# Patient Record
Sex: Female | Born: 1980 | Race: White | Hispanic: No | Marital: Married | Smoking: Current every day smoker
Health system: Southern US, Community
[De-identification: ages and names within clinical notes are randomized; demographics above are authoritative.]

## PROBLEM LIST (undated history)

## (undated) DIAGNOSIS — L989 Disorder of the skin and subcutaneous tissue, unspecified: Secondary | ICD-10-CM

## (undated) DIAGNOSIS — L0390 Cellulitis, unspecified: Secondary | ICD-10-CM

## (undated) HISTORY — PX: INTRAUTERINE DEVICE (IUD) INSERTION: SHX5877

## (undated) HISTORY — DX: Disorder of the skin and subcutaneous tissue, unspecified: L98.9

## (undated) HISTORY — DX: Cellulitis, unspecified: L03.90

---

## 2002-11-05 ENCOUNTER — Other Ambulatory Visit: Admission: RE | Admit: 2002-11-05 | Discharge: 2002-11-05 | Payer: Self-pay | Admitting: Family Medicine

## 2002-11-05 ENCOUNTER — Other Ambulatory Visit: Admission: RE | Admit: 2002-11-05 | Discharge: 2002-11-05 | Payer: Self-pay | Admitting: Internal Medicine

## 2006-06-04 ENCOUNTER — Observation Stay: Payer: Self-pay | Admitting: Unknown Physician Specialty

## 2006-07-21 ENCOUNTER — Inpatient Hospital Stay: Payer: Self-pay | Admitting: Unknown Physician Specialty

## 2009-07-11 ENCOUNTER — Emergency Department: Payer: Self-pay | Admitting: Emergency Medicine

## 2012-12-10 ENCOUNTER — Emergency Department: Payer: Self-pay | Admitting: Emergency Medicine

## 2012-12-10 LAB — URINALYSIS, COMPLETE
Bilirubin,UR: NEGATIVE
Blood: NEGATIVE
Glucose,UR: NEGATIVE mg/dL (ref 0–75)
RBC,UR: 2 /HPF (ref 0–5)
Specific Gravity: 1.01 (ref 1.003–1.030)
WBC UR: 1 /HPF (ref 0–5)

## 2012-12-10 LAB — COMPREHENSIVE METABOLIC PANEL
Albumin: 3.7 g/dL (ref 3.4–5.0)
Alkaline Phosphatase: 80 U/L (ref 50–136)
Anion Gap: 3 — ABNORMAL LOW (ref 7–16)
Bilirubin,Total: 0.4 mg/dL (ref 0.2–1.0)
Calcium, Total: 8.7 mg/dL (ref 8.5–10.1)
Chloride: 106 mmol/L (ref 98–107)
Co2: 29 mmol/L (ref 21–32)
Creatinine: 0.89 mg/dL (ref 0.60–1.30)
EGFR (African American): >60
Glucose: 108 mg/dL — ABNORMAL HIGH (ref 65–99)
Osmolality: 275 (ref 275–301)
Potassium: 3.9 mmol/L (ref 3.5–5.1)
SGOT(AST): 36 U/L (ref 15–37)
SGPT (ALT): 54 U/L (ref 12–78)
Sodium: 138 mmol/L (ref 136–145)

## 2012-12-10 LAB — CBC
HGB: 12.7 g/dL (ref 12.0–16.0)
MCH: 30.3 pg (ref 26.0–34.0)
MCHC: 33.7 g/dL (ref 32.0–36.0)
MCV: 90 fL (ref 80–100)
RBC: 4.2 10*6/uL (ref 3.80–5.20)
RDW: 13.1 % (ref 11.5–14.5)
WBC: 12.8 10*3/uL — ABNORMAL HIGH (ref 3.6–11.0)

## 2012-12-15 ENCOUNTER — Ambulatory Visit: Payer: Self-pay | Admitting: Family Medicine

## 2013-06-29 ENCOUNTER — Ambulatory Visit: Payer: Self-pay | Admitting: Family Medicine

## 2014-01-15 IMAGING — US ABDOMEN ULTRASOUND
1 series · 13 of 25 positions shown · non-contrast
Comparison: none

REASON FOR EXAM: Epigastric pain
COMMENTS:

[Series 1: abdomen ultrasound · 0.31mm/px · 13 of 92 slices shown]
[im 1/92]
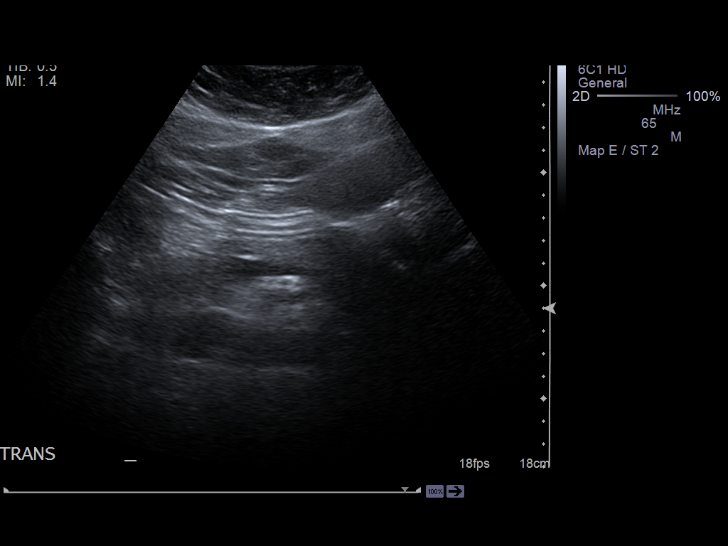
[im 8/92]
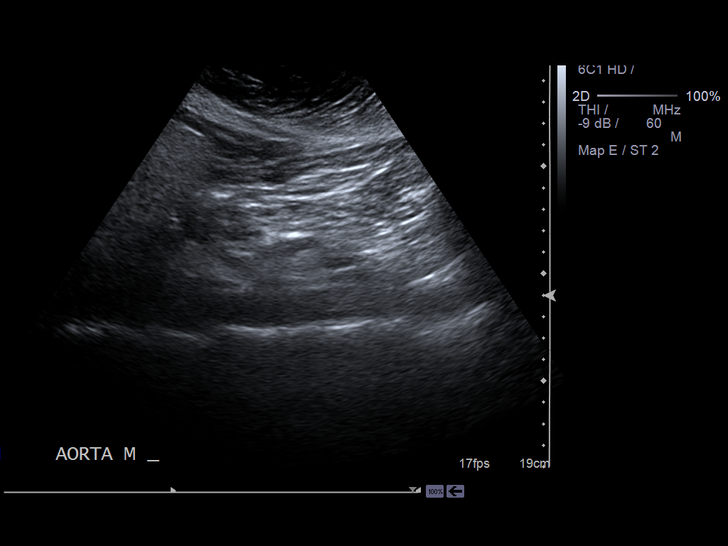
[im 16/92]
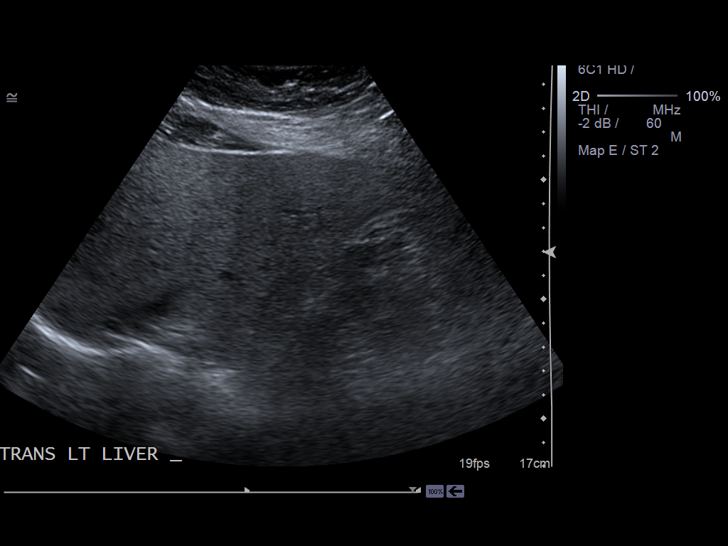
[im 23/92]
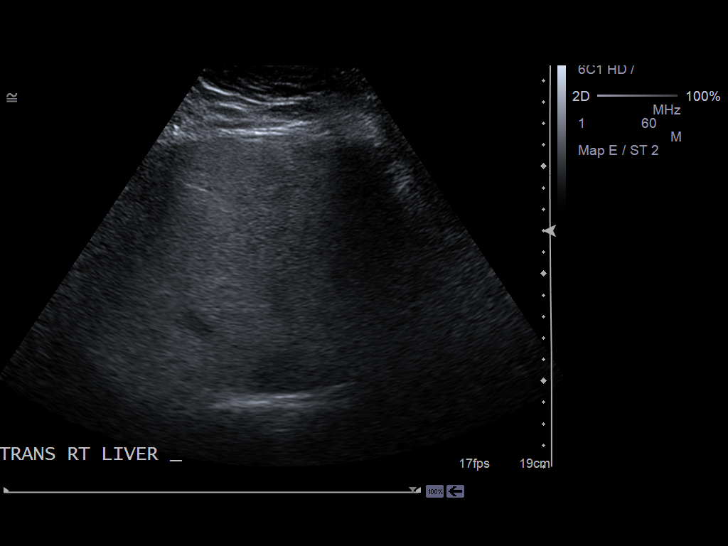
[im 31/92]
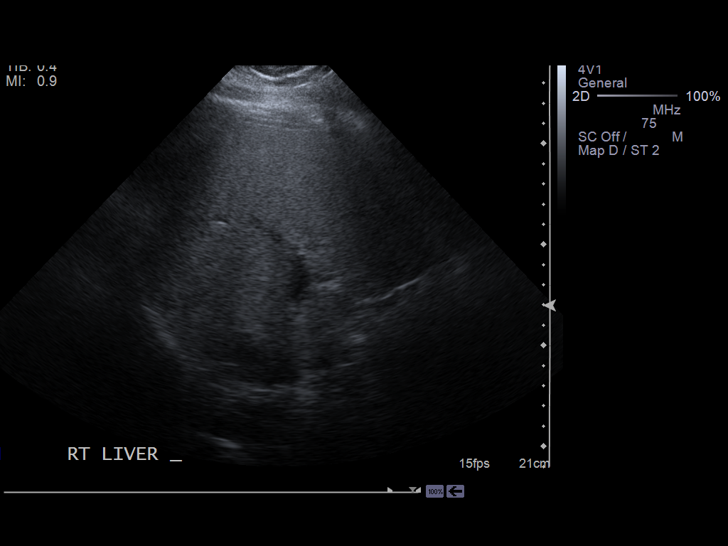
[im 38/92]
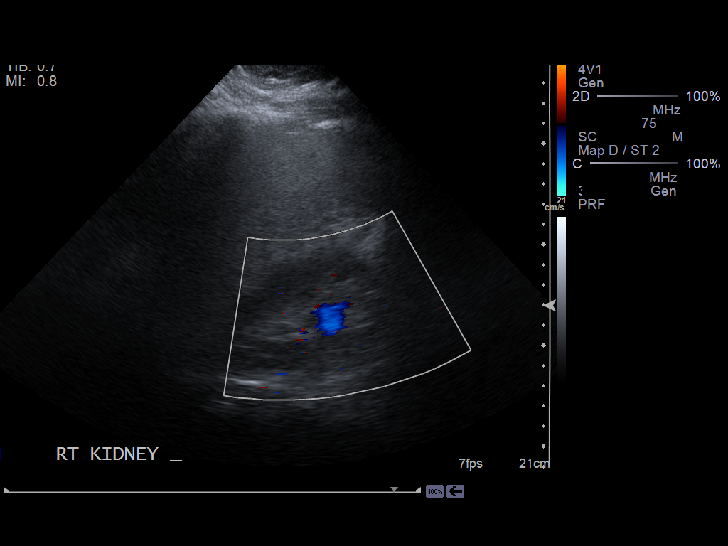
[im 46/92]
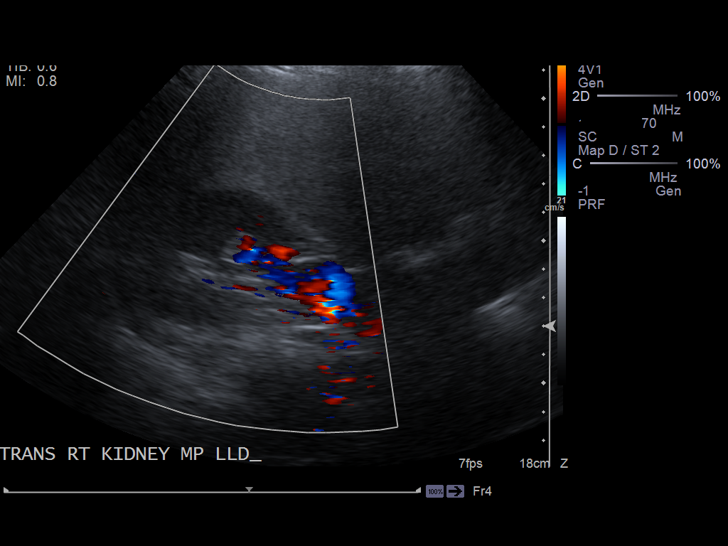
[im 54/92]
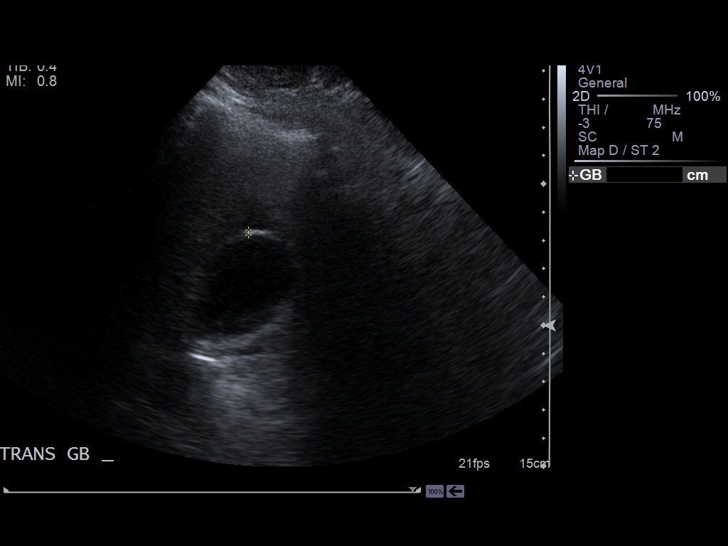
[im 61/92]
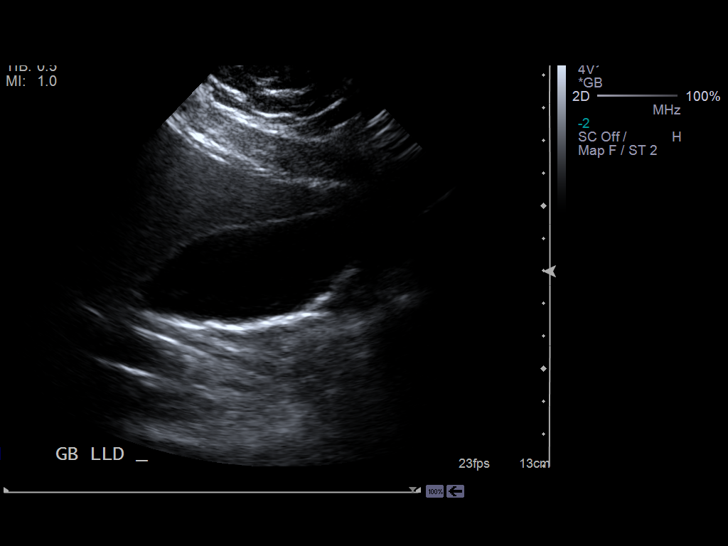
[im 69/92]
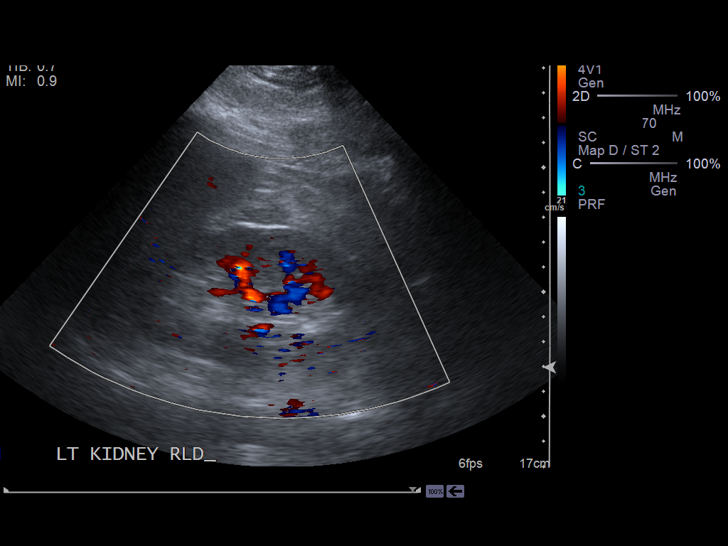
[im 76/92]
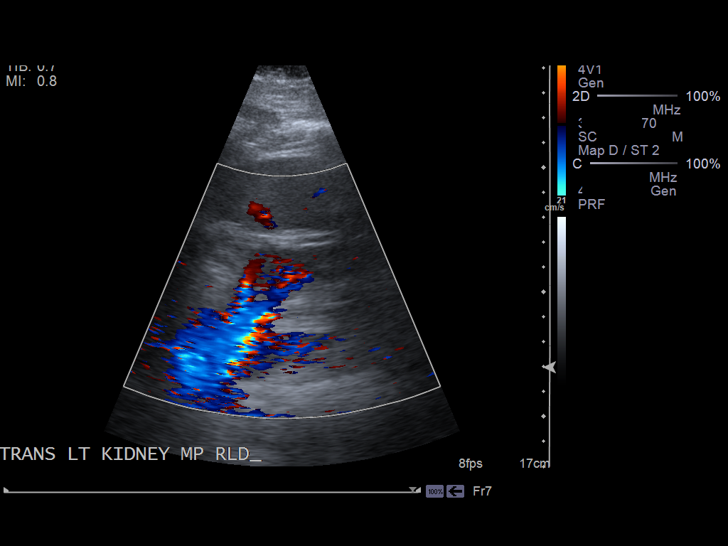
[im 84/92]
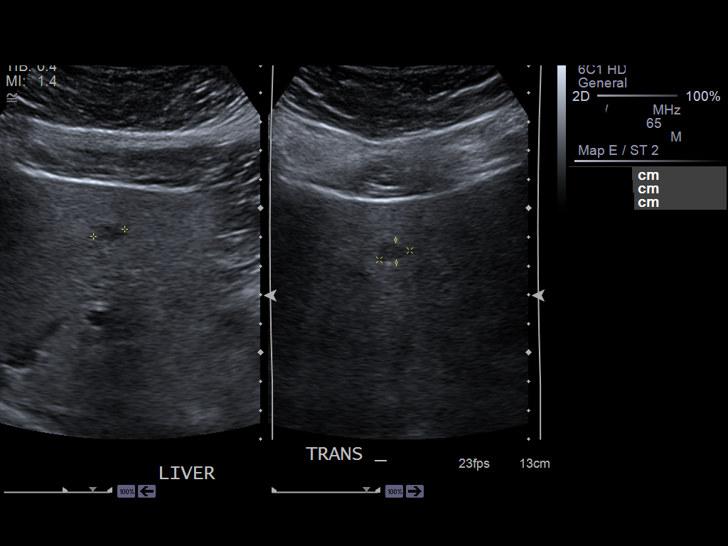
[im 92/92]
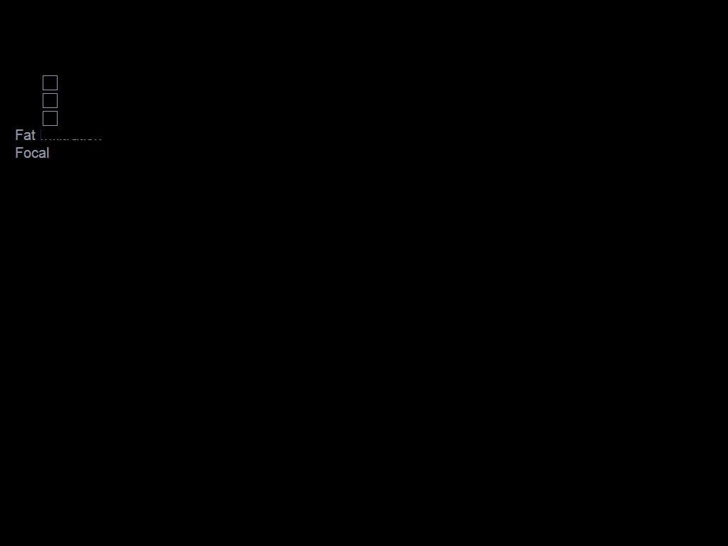

[13 of 25 positions shown; findings below may reference images not displayed]

PROCEDURE:     US  - US ABDOMEN GENERAL SURVEY  - December 15, 2012  [DATE]

RESULT:     The liver demonstrates mildly increased echotexture and is
borderline enlarged measuring 19.2 cm in greatest dimension. A subtle
hypoechoic focus in the left hepatic lobe measures 11 x 11 x 8 mm. This may
reflect a cyst but cannot be characterized further. There is no intrahepatic
ductal dilation. Portal venous flow is normal in direction toward the liver.

The gallbladder is adequately distended with no evidence of stones, wall
thickening, or pericholecystic fluid. There is no positive sonographic
Murphy's sign. The common bile duct is normal at 2.8 mm in diameter.

Bowel gas limits evaluation of the pancreatic head. The pancreatic body and
tail appear normal. The spleen, inferior vena cava, abdominal aorta, and
kidneys are normal in appearance.
IMPRESSION: 1. The liver is borderline enlarged and exhibits increased echogenicity
consistent with fatty infiltration. There is likely a cyst in the left
hepatic lobe but this cannot be precisely characterized.
2. The gallbladder is normal in appearance.
3. Bowel gas limited evaluation of the pancreatic head.
4. The kidneys are normal in appearance.

If the patient's symptoms persist and remain unexplained, followup CT
scanning of the abdomen and pelvis may be useful.

[REDACTED]

## 2014-06-30 ENCOUNTER — Emergency Department: Payer: Self-pay | Admitting: Student

## 2014-06-30 LAB — COMPREHENSIVE METABOLIC PANEL
ALBUMIN: 3.8 g/dL (ref 3.4–5.0)
ALT: 70 U/L — AB
ANION GAP: 4 — AB (ref 7–16)
AST: 36 U/L (ref 15–37)
Alkaline Phosphatase: 57 U/L
BILIRUBIN TOTAL: 0.7 mg/dL (ref 0.2–1.0)
BUN: 12 mg/dL (ref 7–18)
CALCIUM: 8.9 mg/dL (ref 8.5–10.1)
CO2: 30 mmol/L (ref 21–32)
CREATININE: 0.9 mg/dL (ref 0.60–1.30)
Chloride: 106 mmol/L (ref 98–107)
EGFR (African American): >60
EGFR (Non-African Amer.): >60
Glucose: 92 mg/dL (ref 65–99)
OSMOLALITY: 279 (ref 275–301)
Potassium: 4.3 mmol/L (ref 3.5–5.1)
Sodium: 140 mmol/L (ref 136–145)
TOTAL PROTEIN: 8.2 g/dL (ref 6.4–8.2)

## 2014-06-30 LAB — HCG, QUANTITATIVE, PREGNANCY

## 2014-06-30 LAB — CBC
HCT: 39.9 % (ref 35.0–47.0)
HGB: 13.3 g/dL (ref 12.0–16.0)
MCH: 30.7 pg (ref 26.0–34.0)
MCHC: 33.3 g/dL (ref 32.0–36.0)
MCV: 92 fL (ref 80–100)
Platelet: 272 10*3/uL (ref 150–440)
RBC: 4.32 10*6/uL (ref 3.80–5.20)
RDW: 13.7 % (ref 11.5–14.5)
WBC: 7 10*3/uL (ref 3.6–11.0)

## 2014-06-30 LAB — URINALYSIS, COMPLETE
Bilirubin,UR: NEGATIVE
Blood: NEGATIVE
Glucose,UR: NEGATIVE mg/dL (ref 0–75)
Leukocyte Esterase: NEGATIVE
NITRITE: NEGATIVE
PH: 5 (ref 4.5–8.0)
Protein: NEGATIVE
RBC,UR: 2 /HPF (ref 0–5)
Specific Gravity: 1.023 (ref 1.003–1.030)

## 2014-06-30 LAB — TROPONIN I: Troponin-I: <0.02 ng/mL

## 2014-06-30 LAB — LIPASE, BLOOD: Lipase: 157 U/L (ref 73–393)

## 2017-01-01 ENCOUNTER — Other Ambulatory Visit
Admission: RE | Admit: 2017-01-01 | Discharge: 2017-01-01 | Disposition: A | Discharge status: Home / Self Care | Payer: 59 | Source: Ambulatory Visit | Attending: Internal Medicine | Admitting: Internal Medicine

## 2017-01-01 DIAGNOSIS — J029 Acute pharyngitis, unspecified: Secondary | ICD-10-CM | POA: Insufficient documentation

## 2017-01-01 LAB — CBC WITH DIFFERENTIAL/PLATELET
Basophils Absolute: 0.1 10*3/uL (ref 0–0.1)
Basophils Relative: 1 %
Eosinophils Absolute: 0.4 10*3/uL (ref 0–0.7)
Eosinophils Relative: 3 %
HEMATOCRIT: 40.4 % (ref 35.0–47.0)
Hemoglobin: 13.5 g/dL (ref 12.0–16.0)
LYMPHS ABS: 2.6 10*3/uL (ref 1.0–3.6)
Lymphocytes Relative: 18 %
MCH: 30.3 pg (ref 26.0–34.0)
MCHC: 33.4 g/dL (ref 32.0–36.0)
MCV: 90.7 fL (ref 80.0–100.0)
MONO ABS: 1.1 10*3/uL — AB (ref 0.2–0.9)
Monocytes Relative: 8 %
Neutro Abs: 9.9 10*3/uL — ABNORMAL HIGH (ref 1.4–6.5)
Neutrophils Relative %: 70 %
Platelets: 361 10*3/uL (ref 150–440)
RBC: 4.45 MIL/uL (ref 3.80–5.20)
RDW: 13.5 % (ref 11.5–14.5)
WBC: 14 10*3/uL — ABNORMAL HIGH (ref 3.6–11.0)

## 2017-04-07 ENCOUNTER — Ambulatory Visit: Payer: Self-pay | Admitting: Obstetrics and Gynecology

## 2017-04-29 ENCOUNTER — Ambulatory Visit: Payer: Self-pay | Admitting: Obstetrics and Gynecology

## 2017-05-05 ENCOUNTER — Ambulatory Visit (INDEPENDENT_AMBULATORY_CARE_PROVIDER_SITE_OTHER): Payer: BLUE CROSS/BLUE SHIELD | Admitting: Obstetrics and Gynecology

## 2017-05-05 ENCOUNTER — Encounter: Payer: Self-pay | Admitting: Obstetrics and Gynecology

## 2017-05-05 VITALS — BP 128/78 | Ht 64.0 in | Wt 318.0 lb

## 2017-05-05 DIAGNOSIS — Z30432 Encounter for removal of intrauterine contraceptive device: Secondary | ICD-10-CM | POA: Diagnosis not present

## 2017-05-05 DIAGNOSIS — Z975 Presence of (intrauterine) contraceptive device: Secondary | ICD-10-CM | POA: Diagnosis not present

## 2017-05-05 DIAGNOSIS — Z538 Procedure and treatment not carried out for other reasons: Secondary | ICD-10-CM

## 2017-05-06 NOTE — Progress Notes (Signed)
   IUD Removal  Patient identified, informed consent performed, consent signed.  Patient was in the dorsal lithotomy position, normal external genitalia was noted.  A speculum was placed in the patient's vagina, normal discharge was noted, no lesions. The cervix was visualized, no lesions, no abnormal discharge.  The strings of the IUD were not visualized, so Kelly forceps were introduced into the endometrial cavity and the IUD could not be removed. An IUD "hook" was utilized and the strings could not be found.  Aborted procedure due to patient discomfort.   Will order pelvic ultrasound to assess location of IUD.  Thomasene MohairStephen Zakaria Sedor, MD 05/06/2017 1:25 PM

## 2017-05-15 ENCOUNTER — Ambulatory Visit (INDEPENDENT_AMBULATORY_CARE_PROVIDER_SITE_OTHER): Payer: BLUE CROSS/BLUE SHIELD

## 2017-05-15 ENCOUNTER — Ambulatory Visit (INDEPENDENT_AMBULATORY_CARE_PROVIDER_SITE_OTHER): Payer: BLUE CROSS/BLUE SHIELD | Admitting: Obstetrics and Gynecology

## 2017-05-15 ENCOUNTER — Other Ambulatory Visit: Payer: Self-pay | Admitting: Obstetrics and Gynecology

## 2017-05-15 DIAGNOSIS — Z975 Presence of (intrauterine) contraceptive device: Secondary | ICD-10-CM

## 2017-05-15 DIAGNOSIS — Z538 Procedure and treatment not carried out for other reasons: Secondary | ICD-10-CM | POA: Diagnosis not present

## 2017-05-15 DIAGNOSIS — T839XXS Unspecified complication of genitourinary prosthetic device, implant and graft, sequela: Secondary | ICD-10-CM

## 2017-05-16 DIAGNOSIS — T839XXS Unspecified complication of genitourinary prosthetic device, implant and graft, sequela: Secondary | ICD-10-CM | POA: Insufficient documentation

## 2017-05-16 NOTE — Progress Notes (Signed)
   Gynecology Ultrasound Follow Up   Chief Complaint  Patient presents with  . Follow-up  IUD location   History of Present Illness: Patient is a 36 y.o. female who presents today for ultrasound evaluation of location of IUD .  Ultrasound demonstrates the following findings Adnexa: no masses seen  Uterus: anteverted with endometrial stripe  8.4 mm Additional: IUD is in correct location, IUD Strings about 2.5 cm from external cervical os.  Past Medical History:  Diagnosis Date  . Cellulitis   . Skin disorder     Past Surgical History:  Procedure Laterality Date  . CESAREAN SECTION    . INTRAUTERINE DEVICE (IUD) INSERTION      Family History  Problem Relation Age of Onset  . Diabetes Mellitus II Maternal Grandmother   . Diabetes Mellitus II Maternal Grandfather     Social History   Social History  . Marital status: Married    Spouse name: N/A  . Number of children: N/A  . Years of education: N/A   Occupational History  . Not on file.   Social History Main Topics  . Smoking status: Current Every Day Smoker  . Smokeless tobacco: Never Used  . Alcohol use Yes  . Drug use: No  . Sexual activity: Yes    Birth control/ protection: IUD   Other Topics Concern  . Not on file   Social History Narrative  . No narrative on file    No Known Allergies  Prior to Admission medications   Medication Sig Start Date End Date Taking? Authorizing Provider  Ixekizumab (TALTZ [place]) Inject into the skin.    [provider]  levonorgestrel (MIRENA) 20 MCG/24HR IUD 1 each by Intrauterine route once.    [provider]    Physical Exam BP 124/78   Ht 5\' 4"  (1.626 m)    General: NAD HEENT: normocephalic, anicteric Pulmonary: No increased work of breathing Extremities: no edema, erythema, or tenderness Neurologic: Grossly intact, normal gait Psychiatric: mood appropriate, affect full  IUD Removal  Patient identified, informed consent performed, consent  signed.  Patient was in the dorsal lithotomy position, normal external genitalia was noted.  A speculum was placed in the patient's vagina, normal discharge was noted, no lesions. The cervix was visualized, no lesions, no abnormal discharge.  The strings of the IUD were not visualized, so the cervix was gently, serially dilated using Pratt dilators.  Using an IUD retrieval hook, several passes were taken without successfully removing the IUD.  She tolerated the procedure well.   Female chaperone present for pelvic exam:   Assessment: 36 y.o. G1P1001 with retained IUD that can not be removed in clinic.   Plan: Problem List Items Addressed This Visit    Complication of intrauterine device (IUD), sequela     Discussed that only alternative is to remove in the OR. Will schedule for dilation of cervix, hysteroscopy, removal of IUD.  Thomasene Mohair, MD 05/16/2017 2:58 PM

## 2017-05-23 ENCOUNTER — Ambulatory Visit: Payer: Self-pay | Admitting: Certified Nurse Midwife

## 2017-06-04 ENCOUNTER — Telehealth: Payer: Self-pay | Admitting: Obstetrics and Gynecology

## 2017-06-04 NOTE — Telephone Encounter (Signed)
-----   Message from Conard NovakStephen D Jackson, MD sent at 05/16/2017  2:58 PM EDT ----- Regarding: schedule surgery Surgery Booking Request Patient Full Name: Susan Silva   MRN: [id number]  DOB: 04/22/1981  Surgeon: Thomasene MohairStephen Jackson, MD  Requested Surgery Date and Time: December Primary Diagnosis and Code: IUD mechanical complication Secondary Diagnosis and Code:  Surgical Procedure: hysteroscopy, removal of IUD L&D Notification: No Admission Status: same day surgery Length of Surgery: 30 minutes Special Case Needs: hysteroscope with at least one operative port H&P: tbd (date) Phone Interview or Office Pre-Admit: pre-admit Interpreter: n/a Language: english Medical Clearance: none Special Scheduling Instructions: none

## 2017-06-04 NOTE — Telephone Encounter (Signed)
Lmtrc

## 2017-06-12 NOTE — Telephone Encounter (Signed)
Patient is aware of H[facility] Dr. Jean RosenthalJackson, Pre-admit Testing afterwards, and OR on 09/02/17. Patient is aware of $2000 deductible and 40% coninsurance. Ext given.

## 2017-06-24 ENCOUNTER — Telehealth: Payer: Self-pay | Admitting: Obstetrics and Gynecology

## 2017-06-24 NOTE — Telephone Encounter (Signed)
Lmtrc

## 2017-06-24 NOTE — Telephone Encounter (Signed)
-----   Message from Kathi Ludwig sent at 06/24/2017 11:10 AM EDT ----- Regarding: H&P,Hysterectomy Contact: [phone] Pt is calling to speak with Harriett Sine about her up coming schedule appt and surgery. Would like for Nancey to please call her back. Thank you. Huntley Dec

## 2017-06-24 NOTE — Telephone Encounter (Signed)
Patient returned call and said her insurance renews 07/31/17, and would like her surgery rescheduled to October. Patient is aware of H&P at [facility] on 07/18/17 @ 8:10am w/ Dr. Jean Rosenthal, Pre-admit Testing afterwards, and OR on 07/24/17.

## 2017-07-18 ENCOUNTER — Encounter: Payer: Self-pay | Admitting: Obstetrics and Gynecology

## 2017-07-18 ENCOUNTER — Ambulatory Visit (INDEPENDENT_AMBULATORY_CARE_PROVIDER_SITE_OTHER): Payer: BLUE CROSS/BLUE SHIELD | Admitting: Obstetrics and Gynecology

## 2017-07-18 ENCOUNTER — Encounter
Admission: RE | Admit: 2017-07-18 | Discharge: 2017-07-18 | Disposition: A | Discharge status: Home / Self Care | Payer: BLUE CROSS/BLUE SHIELD | Source: Ambulatory Visit | Attending: Obstetrics and Gynecology | Admitting: Obstetrics and Gynecology

## 2017-07-18 VITALS — BP 132/80 | HR 69 | Ht 64.0 in | Wt 326.0 lb

## 2017-07-18 DIAGNOSIS — Z01812 Encounter for preprocedural laboratory examination: Secondary | ICD-10-CM | POA: Diagnosis present

## 2017-07-18 DIAGNOSIS — T839XXS Unspecified complication of genitourinary prosthetic device, implant and graft, sequela: Secondary | ICD-10-CM

## 2017-07-18 LAB — BASIC METABOLIC PANEL
Anion gap: 7 (ref 5–15)
BUN: 11 mg/dL (ref 6–20)
CHLORIDE: 102 mmol/L (ref 101–111)
CO2: 29 mmol/L (ref 22–32)
Calcium: 9.2 mg/dL (ref 8.9–10.3)
Creatinine, Ser: 0.8 mg/dL (ref 0.44–1.00)
GFR calc Af Amer: >60 mL/min (ref >60)
GFR calc non Af Amer: >60 mL/min (ref >60)
GLUCOSE: 100 mg/dL — AB (ref 65–99)
POTASSIUM: 4.1 mmol/L (ref 3.5–5.1)
Sodium: 138 mmol/L (ref 135–145)

## 2017-07-18 LAB — HEMOGLOBIN: Hemoglobin: 13.1 g/dL (ref 12.0–16.0)

## 2017-07-18 NOTE — Progress Notes (Signed)
Preoperative History and Physical  Susan CassetteLisa G Bramel is a 36 y.o. G1P1001 here for surgical management of retained IUD.   No significant preoperative concerns.  History of Present Illness: 36 y.o. 891P1001 female who desires to have her IUD removed.  Her strings were not visible in clinic. A pelvic ultrasound showed the IUD in the correct location.  She was unable to tolerate attempts to remove the IUD in clinic.  Proposed surgery: hysteroscopy, dilation and curettage, removal of intrauterine device  Past Medical History:  Diagnosis Date  . Cellulitis   . Skin disorder    Past Surgical History:  Procedure Laterality Date  . CESAREAN SECTION    . INTRAUTERINE DEVICE (IUD) INSERTION     OB History  Gravida Para Term Preterm AB Living  1 1 1     1   SAB TAB Ectopic Multiple Live Births          1    # Outcome Date GA Lbr Len/2nd Weight Sex Delivery Anes PTL Lv  1 Term             Patient denies any other pertinent gynecologic issues.   Current Outpatient Prescriptions on File Prior to Visit  Medication Sig Dispense Refill  . fluticasone (FLONASE) 50 MCG/ACT nasal spray Place 1 spray into both nostrils daily as needed for allergies or rhinitis.    Marland Kitchen. TREMFYA 100 MG/ML SOSY Inject 100 mg into the skin See admin instructions. Every other month  0   No current facility-administered medications on file prior to visit.    No Known Allergies  Social History:   reports that she has been smoking.  She has never used smokeless tobacco. She reports that she drinks alcohol. She reports that she does not use drugs.  Family History  Problem Relation Age of Onset  . Diabetes Mellitus II Maternal Grandmother   . Diabetes Mellitus II Maternal Grandfather     Review of Systems: Noncontributory  PHYSICAL EXAM: Blood pressure 132/80, pulse 69, height 5\' 4"  (1.626 m), weight (!) 326 lb (147.9 kg). CONSTITUTIONAL: Well-developed, well-nourished female in no acute distress.  HENT:   Normocephalic, atraumatic, External right and left ear normal. Oropharynx is clear and moist EYES: Conjunctivae and EOM are normal. Pupils are equal, round, and reactive to light. No scleral icterus.  NECK: Normal range of motion, supple, no masses SKIN: Skin is warm and dry. No rash noted. Not diaphoretic. No erythema. No pallor. NEUROLGIC: Alert and oriented to person, place, and time. Normal reflexes, muscle tone coordination. No cranial nerve deficit noted. PSYCHIATRIC: Normal mood and affect. Normal behavior. Normal judgment and thought content. CARDIOVASCULAR: Normal heart rate noted, regular rhythm RESPIRATORY: Effort and breath sounds normal, no problems with respiration noted ABDOMEN: Soft, nontender, nondistended. PELVIC: Deferred MUSCULOSKELETAL: Normal range of motion. No edema and no tenderness. 2+ distal pulses.   Assessment: Patient Active Problem List   Diagnosis Date Noted  . Complication of intrauterine device (IUD), sequela 05/16/2017    Plan: Patient will undergo surgical management with hysteroscopy, dilation and curettage, removal of intrauterine device.   The risks of surgery were discussed in detail with the patient including but not limited to: bleeding which may require transfusion or reoperation; infection which may require antibiotics; injury to surrounding organs which may involve bowel, bladder, ureters ; need for additional procedures including laparoscopy or laparotomy; thromboembolic phenomenon, surgical site problems and other postoperative/anesthesia complications. Likelihood of success in alleviating the patient's condition was discussed. Routine postoperative  instructions will be reviewed with the patient and her family in detail after surgery.  The patient concurred with the proposed plan, giving informed written consent for the surgery.  Preoperative prophylactic antibiotics and SCDs ordered on call to the OR.    Thomasene Mohair, MD 07/18/2017 8:21 AM

## 2017-07-18 NOTE — Patient Instructions (Signed)
Your procedure is scheduled on: Thursday 07/24/17 Report to DAY SURGERY. 2ND FLOOR MEDICAL MALL ENTRANCE. To find out your arrival time please call [phone] between 1PM - 3PM on Wednesday 07/23/17.  Remember: Instructions that are not followed completely may result in serious medical risk, up to and including death, or upon the discretion of your surgeon and anesthesiologist your surgery may need to be rescheduled.    __X__ 1. Do not eat anything after midnight the night before your    procedure.  No gum chewing or hard candies.  You may drink clear   liquids up to 2 hours before you are scheduled to arrive at the   hospital for your procedure. Do not drink clear liquids within 2   hours of scheduled arrival to the hospital as this may lead to your   procedure being delayed or rescheduled.       Clear liquids include:   Water or Apple juice without pulp   Clear carbohydrate beverage such as Clearfast or Gatorade   Black coffee or Clear Tea (no milk, no creamer, do not add anything   to the coffee or tea)    Diabetics should only drink water   __X__ 2. No Alcohol for 24 hours before or after surgery.   ____ 3. Bring all medications with you on the day of surgery if instructed.    __X__ 4. Notify your doctor if there is any change in your medical condition     (cold, fever, infections).             __X___5. No smoking within 24 hours of your surgery.     Do not wear jewelry, make-up, hairpins, clips or nail polish.  Do not wear lotions, powders, or perfumes.   Do not shave 48 hours prior to surgery. Men may shave face and neck.  Do not bring valuables to the hospital.    [facility] any belongings or valuables.               Contacts, dentures or bridgework may not be worn into surgery.  Leave your suitcase in the car. After surgery it may be brought to your room.  For patients admitted to the hospital, discharge time is determined by your                 treatment team.   Patients discharged the day of surgery will not be allowed to drive home.   Please read over the following fact sheets that you were given:   MRSA Information   ____ Take these medicines the morning of surgery with A SIP OF WATER:    1. NONE  2.   3.   4.  5.  6.  ____ Fleet Enema (as directed)   ____ Use CHG Soap/SAGE wipes as directed  ____ Use inhalers on the day of surgery  ____ Stop metformin 2 days prior to surgery    ____ Take 1/2 of usual insulin dose the night before surgery and none on the morning of surgery.   ____ Stop Coumadin/Plavix/aspirin on   __X__ Stop Anti-inflammatories such as Advil, Aleve, Ibuprofen, Motrin, Naproxen, Naprosyn, Goodies,powder, or aspirin products.  OK to take Tylenol.   __X__ Stop supplements, Vitamin E, Fish Oil until after surgery.    ____ Bring C-Pap to the hospital.

## 2017-07-24 ENCOUNTER — Encounter
Admission: RE | Disposition: A | Discharge status: Home / Self Care | Payer: Self-pay | Source: Ambulatory Visit | Attending: Obstetrics and Gynecology

## 2017-07-24 ENCOUNTER — Telehealth: Payer: Self-pay | Admitting: Obstetrics and Gynecology

## 2017-07-24 ENCOUNTER — Encounter: Payer: Self-pay | Admitting: Certified Registered Nurse Anesthetist

## 2017-07-24 ENCOUNTER — Ambulatory Visit: Payer: BLUE CROSS/BLUE SHIELD | Admitting: Certified Registered Nurse Anesthetist

## 2017-07-24 ENCOUNTER — Ambulatory Visit
Admission: RE | Admit: 2017-07-24 | Discharge: 2017-07-24 | Disposition: A | Discharge status: Home / Self Care | Payer: BLUE CROSS/BLUE SHIELD | Source: Ambulatory Visit | Attending: Obstetrics and Gynecology | Admitting: Obstetrics and Gynecology

## 2017-07-24 DIAGNOSIS — F17210 Nicotine dependence, cigarettes, uncomplicated: Secondary | ICD-10-CM | POA: Diagnosis not present

## 2017-07-24 DIAGNOSIS — Y768 Miscellaneous obstetric and gynecological devices associated with adverse incidents, not elsewhere classified: Secondary | ICD-10-CM | POA: Diagnosis not present

## 2017-07-24 DIAGNOSIS — T8332XA Displacement of intrauterine contraceptive device, initial encounter: Secondary | ICD-10-CM | POA: Diagnosis not present

## 2017-07-24 DIAGNOSIS — Y9289 Other specified places as the place of occurrence of the external cause: Secondary | ICD-10-CM | POA: Insufficient documentation

## 2017-07-24 DIAGNOSIS — T839XXS Unspecified complication of genitourinary prosthetic device, implant and graft, sequela: Secondary | ICD-10-CM

## 2017-07-24 HISTORY — PX: IUD REMOVAL: SHX5392

## 2017-07-24 LAB — POCT PREGNANCY, URINE: PREG TEST UR: NEGATIVE

## 2017-07-24 SURGERY — REMOVAL, INTRAUTERINE DEVICE
Anesthesia: Monitor Anesthesia Care | Wound class: Clean Contaminated

## 2017-07-24 MED ORDER — LACTATED RINGERS IV SOLN
INTRAVENOUS | Status: DC
  Administered 2017-07-24: 09:00:00 via INTRAVENOUS

## 2017-07-24 MED ORDER — DEXAMETHASONE SODIUM PHOSPHATE 10 MG/ML IJ SOLN
INTRAMUSCULAR | Status: DC | PRN
  Administered 2017-07-24: 8 mg via INTRAVENOUS

## 2017-07-24 MED ORDER — MIDAZOLAM HCL 2 MG/2ML IJ SOLN
INTRAMUSCULAR | Status: DC | PRN
Start: 2017-07-24 — End: 2017-07-24
  Administered 2017-07-24: 2 mg via INTRAVENOUS

## 2017-07-24 MED ORDER — ONDANSETRON HCL 4 MG/2ML IJ SOLN
INTRAMUSCULAR | Status: AC
  Filled 2017-07-24: qty 2

## 2017-07-24 MED ORDER — LIDOCAINE HCL (CARDIAC) 20 MG/ML IV SOLN
INTRAVENOUS | Status: DC | PRN
  Administered 2017-07-24: 60 mg via INTRAVENOUS

## 2017-07-24 MED ORDER — FENTANYL CITRATE (PF) 100 MCG/2ML IJ SOLN
INTRAMUSCULAR | Status: DC | PRN
  Administered 2017-07-24: 25 ug via INTRAVENOUS

## 2017-07-24 MED ORDER — PROPOFOL 10 MG/ML IV BOLUS
INTRAVENOUS | Status: DC | PRN
  Administered 2017-07-24 (×5): 10 mg via INTRAVENOUS
  Administered 2017-07-24: 20 mg via INTRAVENOUS
  Administered 2017-07-24 (×3): 10 mg via INTRAVENOUS
  Administered 2017-07-24: 40 mg via INTRAVENOUS
  Administered 2017-07-24: 10 mg via INTRAVENOUS

## 2017-07-24 MED ORDER — MIDAZOLAM HCL 2 MG/2ML IJ SOLN
INTRAMUSCULAR | Status: AC
  Filled 2017-07-24: qty 2

## 2017-07-24 MED ORDER — SUCCINYLCHOLINE CHLORIDE 20 MG/ML IJ SOLN
INTRAMUSCULAR | Status: AC
  Filled 2017-07-24: qty 1

## 2017-07-24 MED ORDER — PROPOFOL 10 MG/ML IV BOLUS
INTRAVENOUS | Status: AC
  Filled 2017-07-24: qty 20

## 2017-07-24 MED ORDER — LIDOCAINE HCL (PF) 2 % IJ SOLN
INTRAMUSCULAR | Status: AC
  Filled 2017-07-24: qty 10

## 2017-07-24 MED ORDER — FENTANYL CITRATE (PF) 100 MCG/2ML IJ SOLN
25.0000 ug | INTRAMUSCULAR | Status: DC | PRN
  Administered 2017-07-24: 25 ug via INTRAVENOUS

## 2017-07-24 MED ORDER — FENTANYL CITRATE (PF) 100 MCG/2ML IJ SOLN
INTRAMUSCULAR | Status: AC
  Filled 2017-07-24: qty 2

## 2017-07-24 MED ORDER — ONDANSETRON HCL 4 MG/2ML IJ SOLN: 4.0000 mg | Freq: Once | INTRAMUSCULAR | Status: DC | PRN

## 2017-07-24 MED ORDER — FAMOTIDINE 20 MG PO TABS
20.0000 mg | ORAL_TABLET | Freq: Once | ORAL | Status: AC
  Administered 2017-07-24: 20 mg via ORAL

## 2017-07-24 MED ORDER — ONDANSETRON HCL 4 MG/2ML IJ SOLN
INTRAMUSCULAR | Status: DC | PRN
  Administered 2017-07-24: 4 mg via INTRAVENOUS

## 2017-07-24 MED ORDER — IBUPROFEN 600 MG PO TABS: 600.0000 mg | ORAL_TABLET | Freq: Four times a day (QID) | ORAL | 0 refills | Status: DC | PRN

## 2017-07-24 MED ORDER — SILVER NITRATE-POT NITRATE 75-25 % EX MISC
CUTANEOUS | Status: DC | PRN
  Administered 2017-07-24: 1

## 2017-07-24 MED ORDER — ROCURONIUM BROMIDE 50 MG/5ML IV SOLN
INTRAVENOUS | Status: AC
Start: 2017-07-24 — End: 2017-07-24
  Filled 2017-07-24: qty 1

## 2017-07-24 MED ORDER — DEXAMETHASONE SODIUM PHOSPHATE 10 MG/ML IJ SOLN
INTRAMUSCULAR | Status: AC
  Filled 2017-07-24: qty 1

## 2017-07-24 MED ORDER — FAMOTIDINE 20 MG PO TABS
ORAL_TABLET | ORAL | Status: AC
  Filled 2017-07-24: qty 1

## 2017-07-24 SURGICAL SUPPLY — 13 items
BAG URO DRAIN 2000ML W/SPOUT (MISCELLANEOUS) IMPLANT
CATH FOLEY 2WAY  5CC 16FR (CATHETERS)
CATH ROBINSON RED A/P 16FR (CATHETERS) ×3 IMPLANT
CATH URTH 16FR FL 2W BLN LF (CATHETERS) IMPLANT
GLOVE BIO SURGEON STRL SZ7.5 (GLOVE) ×3 IMPLANT
GOWN STRL REUS W/ TWL LRG LVL3 (GOWN DISPOSABLE) ×2 IMPLANT
GOWN STRL REUS W/TWL LRG LVL3 (GOWN DISPOSABLE) ×4
KIT RM TURNOVER CYSTO AR (KITS) ×3 IMPLANT
PACK DNC HYST (MISCELLANEOUS) ×3 IMPLANT
PAD OB MATERNITY 4.3X12.25 (PERSONAL CARE ITEMS) ×3 IMPLANT
PAD PREP 24X41 OB/GYN DISP (PERSONAL CARE ITEMS) ×3 IMPLANT
SYRINGE 10CC LL (SYRINGE) IMPLANT
TOWEL OR 17X26 4PK STRL BLUE (TOWEL DISPOSABLE) ×3 IMPLANT

## 2017-07-24 NOTE — Anesthesia Post-op Follow-up Note (Signed)
Anesthesia QCDR form completed.        

## 2017-07-24 NOTE — Transfer of Care (Signed)
Immediate Anesthesia Transfer of Care Note  Patient: Susan Silva  Procedure(s) Performed: DILATION & INTRAUTERINE DEVICE (IUD) REMOVAL (N/A )  Patient Location: PACU  Anesthesia Type:MAC  Level of Consciousness: awake, alert , oriented and patient cooperative  Airway & Oxygen Therapy: Patient Spontanous Breathing  Post-op Assessment: Report given to RN, Post -op Vital signs reviewed and stable and Patient moving all extremities X 4  Post vital signs: Reviewed and stable  Last Vitals:  Vitals:   07/24/17 0812 07/24/17 1003  BP: 126/82 128/75  Pulse: 86 76  Resp: 16 18  Temp: 36.9 C (!) 36.3 C  SpO2: 100% 100%    Last Pain:  Vitals:   07/24/17 0812  TempSrc: Oral         Complications: No apparent anesthesia complications

## 2017-07-24 NOTE — Op Note (Signed)
  Operative Note    Pre-Op Diagnosis: intrauterine device mechanical complication, retained   Post-Op Diagnosis: intrauterine device mechanical complication, retained   Procedures:  1. Cervical dilation 2. Removal of intrauterine device   Primary Surgeon: Thomasene MohairStephen Vincentina Sollers, MD   EBL: minimal   IVF: 300 mL   Urine output: n/a  Specimens: no tissue specimens  Drains: none  Complications: None   Disposition: PACU   Condition: Stable   Findings: intact intrauterine device, normal appearing cervix and vagina  Procedure Summary:  The patient was taken to the operating room and placed in the dorsal supine lithotomy position in [place] stirrups.  She was placed under mild sedation with propofol.  A timeout was performed.  Patient was prepped and draped in the usual, sterile fashion.  A sterile speculum was placed in the vagina.  The anterior lip of the cervix was grasped with a single-tooth tenaculum.  The cervix was minimally dilated using Pratt dilators.  Randall stone forceps were introduced gently through the cervix and on the second pass the IUD was extracted intact.  The Randall stone forceps were advanced only minimally past the internal cervical os.  The single-tooth tenaculum was removed from the cervix.  Hemostasis was obtained using silver nitrate.  It was ensured that the vagina had no instruments or sponges.  The sterile speculum was removed.  This concluded the procedure.  The patient tolerated the procedure well.  Sponge, lap, needle, and instrument counts were correct x 2.  VTE prophylaxis: SCDs. Antibiotic prophylaxis: none indicated and none given. She was awakened in the operating room and was taken to the PACU in stable condition.   Thomasene MohairStephen Tari Lecount, MD 07/24/2017 9:52 AM

## 2017-07-24 NOTE — Telephone Encounter (Signed)
Pt is calling about her bill . For DOS 05/15/17 for an ultrasound. Pt would like a call back because insurance isn't covering.

## 2017-07-24 NOTE — Anesthesia Postprocedure Evaluation (Signed)
Anesthesia Post Note  Patient: Susan Silva  Procedure(s) Performed: DILATION & INTRAUTERINE DEVICE (IUD) REMOVAL (N/A )  Patient location during evaluation: PACU Anesthesia Type: MAC Level of consciousness: awake and alert Pain management: pain level controlled Vital Signs Assessment: post-procedure vital signs reviewed and stable Respiratory status: spontaneous breathing, nonlabored ventilation, respiratory function stable and patient connected to nasal cannula oxygen Cardiovascular status: blood pressure returned to baseline and stable Postop Assessment: no apparent nausea or vomiting Anesthetic complications: no     Last Vitals:  Vitals:   07/24/17 1025 07/24/17 1036  BP: 117/69 109/66  Pulse: 88 66  Resp: 19 18  Temp: (!) 36.3 C 36.8 C  SpO2: 96% 100%    Last Pain:  Vitals:   07/24/17 1036  TempSrc: Temporal  PainSc:                  Molli Barrows

## 2017-07-24 NOTE — Discharge Instructions (Signed)

## 2017-07-24 NOTE — Interval H&P Note (Signed)
History and Physical Interval Note:  07/24/2017 9:13 AM  Susan Silva  has presented today for surgery, with the diagnosis of IUD MECHANICAL COMPLICATION  The various methods of treatment have been discussed with the patient and family. After consideration of risks, benefits and other options for treatment, the patient has consented to  Procedure(s): HYSTEROSCOPY (N/A) INTRAUTERINE DEVICE (IUD) REMOVAL (N/A) as a surgical intervention .  The patient's history has been reviewed, patient examined, no change in status, stable for surgery.  I have reviewed the patient's chart and labs.  Questions were answered to the patient's satisfaction.     Thomasene MohairStephen Danique Hartsough, MD 07/24/2017 9:13 AM

## 2017-07-24 NOTE — H&P (View-Only) (Signed)
  Preoperative History and Physical  Susan Silva is a 36 y.o. G1P1001 here for surgical management of retained IUD.   No significant preoperative concerns.  History of Present Illness: 36 y.o. G1P1001 female who desires to have her IUD removed.  Her strings were not visible in clinic. A pelvic ultrasound showed the IUD in the correct location.  She was unable to tolerate attempts to remove the IUD in clinic.  Proposed surgery: hysteroscopy, dilation and curettage, removal of intrauterine device  Past Medical History:  Diagnosis Date  . Cellulitis   . Skin disorder    Past Surgical History:  Procedure Laterality Date  . CESAREAN SECTION    . INTRAUTERINE DEVICE (IUD) INSERTION     OB History  Gravida Para Term Preterm AB Living  1 1 1     1  SAB TAB Ectopic Multiple Live Births          1    # Outcome Date GA Lbr Len/2nd Weight Sex Delivery Anes PTL Lv  1 Term             Patient denies any other pertinent gynecologic issues.   Current Outpatient Prescriptions on File Prior to Visit  Medication Sig Dispense Refill  . fluticasone (FLONASE) 50 MCG/ACT nasal spray Place 1 spray into both nostrils daily as needed for allergies or rhinitis.    . TREMFYA 100 MG/ML SOSY Inject 100 mg into the skin See admin instructions. Every other month  0   No current facility-administered medications on file prior to visit.    No Known Allergies  Social History:   reports that she has been smoking.  She has never used smokeless tobacco. She reports that she drinks alcohol. She reports that she does not use drugs.  Family History  Problem Relation Age of Onset  . Diabetes Mellitus II Maternal Grandmother   . Diabetes Mellitus II Maternal Grandfather     Review of Systems: Noncontributory  PHYSICAL EXAM: Blood pressure 132/80, pulse 69, height 5' 4" (1.626 m), weight (!) 326 lb (147.9 kg). CONSTITUTIONAL: Well-developed, well-nourished female in no acute distress.  HENT:   Normocephalic, atraumatic, External right and left ear normal. Oropharynx is clear and moist EYES: Conjunctivae and EOM are normal. Pupils are equal, round, and reactive to light. No scleral icterus.  NECK: Normal range of motion, supple, no masses SKIN: Skin is warm and dry. No rash noted. Not diaphoretic. No erythema. No pallor. NEUROLGIC: Alert and oriented to person, place, and time. Normal reflexes, muscle tone coordination. No cranial nerve deficit noted. PSYCHIATRIC: Normal mood and affect. Normal behavior. Normal judgment and thought content. CARDIOVASCULAR: Normal heart rate noted, regular rhythm RESPIRATORY: Effort and breath sounds normal, no problems with respiration noted ABDOMEN: Soft, nontender, nondistended. PELVIC: Deferred MUSCULOSKELETAL: Normal range of motion. No edema and no tenderness. 2+ distal pulses.   Assessment: Patient Active Problem List   Diagnosis Date Noted  . Complication of intrauterine device (IUD), sequela 05/16/2017    Plan: Patient will undergo surgical management with hysteroscopy, dilation and curettage, removal of intrauterine device.   The risks of surgery were discussed in detail with the patient including but not limited to: bleeding which may require transfusion or reoperation; infection which may require antibiotics; injury to surrounding organs which may involve bowel, bladder, ureters ; need for additional procedures including laparoscopy or laparotomy; thromboembolic phenomenon, surgical site problems and other postoperative/anesthesia complications. Likelihood of success in alleviating the patient's condition was discussed. Routine postoperative   instructions will be reviewed with the patient and her family in detail after surgery.  The patient concurred with the proposed plan, giving informed written consent for the surgery.  Preoperative prophylactic antibiotics and SCDs ordered on call to the OR.    Aleksia Freiman, MD 07/18/2017 8:21 AM     

## 2017-07-24 NOTE — Anesthesia Preprocedure Evaluation (Signed)
Anesthesia Evaluation  Patient identified by MRN, date of birth, ID band Patient awake    Reviewed: Allergy & Precautions, H&P , NPO status , Patient's Chart, lab work & pertinent test results, reviewed documented beta blocker date and time   Airway Mallampati: III   Neck ROM: full    Dental  (+) Poor Dentition   Pulmonary neg pulmonary ROS, Current Smoker,    Pulmonary exam normal        Cardiovascular negative cardio ROS Normal cardiovascular exam Rhythm:Regular Rate:Normal     Neuro/Psych negative neurological ROS  negative psych ROS   GI/Hepatic negative GI ROS, Neg liver ROS,   Endo/Other  negative endocrine ROSMorbid obesity  Renal/GU negative Renal ROS  negative genitourinary   Musculoskeletal   Abdominal   Peds  Hematology negative hematology ROS (+)   Anesthesia Other Findings Past Medical History: No date: Cellulitis No date: Skin disorder Past Surgical History: No date: CESAREAN SECTION No date: INTRAUTERINE DEVICE (IUD) INSERTION BMI    Body Mass Index:  54.93 kg/m     Reproductive/Obstetrics negative OB ROS                             Anesthesia Physical Anesthesia Plan  ASA: III  Anesthesia Plan: General and MAC   Post-op Pain Management:    Induction:   PONV Risk Score and Plan: 4 or greater and Ondansetron, Dexamethasone, Midazolam, Scopolamine patch - Pre-op and Propofol infusion  Airway Management Planned:   Additional Equipment:   Intra-op Plan:   Post-operative Plan:   Informed Consent: I have reviewed the patients History and Physical, chart, labs and discussed the procedure including the risks, benefits and alternatives for the proposed anesthesia with the patient or authorized representative who has indicated his/her understanding and acceptance.   Dental Advisory Given  Plan Discussed with: CRNA  Anesthesia Plan Comments: (Pt. Strongly  desires trying to do the above under heavy sedation.  I have discussed the pros and cons of this approach. Our plan will be to try this under sedation and if this fails, we will proceed to GA with LMA if necessary.  She will discuss this further with Dr. Glennon Mac and she may change her mind, requesting GA.  JA)        Anesthesia Quick Evaluation

## 2017-07-25 ENCOUNTER — Encounter: Payer: Self-pay | Admitting: Obstetrics and Gynecology

## 2017-08-08 ENCOUNTER — Ambulatory Visit: Payer: BLUE CROSS/BLUE SHIELD | Admitting: Obstetrics and Gynecology

## 2017-08-25 ENCOUNTER — Encounter: Payer: BLUE CROSS/BLUE SHIELD | Admitting: Obstetrics and Gynecology

## 2017-09-02 ENCOUNTER — Other Ambulatory Visit: Payer: 59

## 2019-11-15 ENCOUNTER — Other Ambulatory Visit (HOSPITAL_COMMUNITY)
Admission: RE | Admit: 2019-11-15 | Discharge: 2019-11-15 | Disposition: A | Discharge status: Home / Self Care | Payer: BC Managed Care – PPO | Source: Ambulatory Visit | Attending: Obstetrics and Gynecology | Admitting: Obstetrics and Gynecology

## 2019-11-15 ENCOUNTER — Other Ambulatory Visit: Payer: Self-pay

## 2019-11-15 ENCOUNTER — Encounter: Payer: Self-pay | Admitting: Obstetrics and Gynecology

## 2019-11-15 ENCOUNTER — Ambulatory Visit (INDEPENDENT_AMBULATORY_CARE_PROVIDER_SITE_OTHER): Payer: BC Managed Care – PPO | Admitting: Obstetrics and Gynecology

## 2019-11-15 VITALS — BP 142/74 | HR 77 | Ht 64.0 in | Wt 336.0 lb

## 2019-11-15 DIAGNOSIS — Z124 Encounter for screening for malignant neoplasm of cervix: Secondary | ICD-10-CM | POA: Diagnosis present

## 2019-11-15 DIAGNOSIS — Z3202 Encounter for pregnancy test, result negative: Secondary | ICD-10-CM

## 2019-11-15 DIAGNOSIS — Z131 Encounter for screening for diabetes mellitus: Secondary | ICD-10-CM

## 2019-11-15 DIAGNOSIS — Z6841 Body Mass Index (BMI) 40.0 and over, adult: Secondary | ICD-10-CM

## 2019-11-15 DIAGNOSIS — N911 Secondary amenorrhea: Secondary | ICD-10-CM

## 2019-11-15 DIAGNOSIS — N912 Amenorrhea, unspecified: Secondary | ICD-10-CM

## 2019-11-15 DIAGNOSIS — Z01419 Encounter for gynecological examination (general) (routine) without abnormal findings: Secondary | ICD-10-CM | POA: Diagnosis not present

## 2019-11-15 DIAGNOSIS — Z1239 Encounter for other screening for malignant neoplasm of breast: Secondary | ICD-10-CM

## 2019-11-15 LAB — POCT URINE PREGNANCY: Preg Test, Ur: NEGATIVE

## 2019-11-15 NOTE — Patient Instructions (Signed)
[facility] [address] [place] [address]  [facility] [place]  [address] [place] [address]  Phone: [phone]  

## 2019-11-15 NOTE — Progress Notes (Signed)
Susan Annual Exam   PCP: Derinda Late, MD  Chief Complaint:  Chief Complaint  Patient presents with  . Gynecologic Exam    Amenorrhea no cycle since December    History of Present Illness: Patient is a 39 y.o. G1P1001 presents for annual exam. The patient has no complaints today.   LMP: Patient's last menstrual period was 09/20/2019 (approximate). The patient has only had two menstrual cycles in the past year with last in December.  She does report Silva pound weight gain.  Denies hirsutism, acne, temperature intolerance, hair loss, occasional headaches, no nipple discharge.  No exogenous hormones.  No changes in medications.  Prior to this menses were irregular secondary to IUD which was removed hysteroscopically by Dr. Glennon Mac on 07/24/2017.  The patient is sexually active.  She denies dyspareunia.  The patient does perform self breast exams.  There is no notable family history of breast or ovarian cancer in her family.  The patient wears seatbelts: yes.   The patient has regular exercise: not asked.    The patient denies current symptoms of depression.    Review of Systems: Review of Systems  Constitutional: Negative for chills and fever.  HENT: Negative for congestion.   Respiratory: Negative for cough and shortness of breath.   Cardiovascular: Negative for chest pain and palpitations.  Gastrointestinal: Negative for abdominal pain, constipation, diarrhea, heartburn, nausea and vomiting.  Genitourinary: Negative for dysuria, frequency and urgency.  Skin: Negative for itching and rash.  Neurological: Negative for dizziness and headaches.  Endo/Heme/Allergies: Negative for polydipsia.  Psychiatric/Behavioral: Negative for depression.    Past Medical History:  Past Medical History:  Diagnosis Date  . Cellulitis   . Skin disorder     Past Surgical History:  Past Surgical History:  Procedure Laterality Date  . CESAREAN SECTION    . INTRAUTERINE DEVICE (IUD)  INSERTION    . IUD REMOVAL N/A 07/24/2017   Procedure: DILATION & INTRAUTERINE DEVICE (IUD) REMOVAL;  Surgeon: Will Bonnet, MD;  Location: [facility] ORS;  Service: Susan;  Laterality: N/A;    Gynecologic History:  Patient's last menstrual period was 09/20/2019 (approximate).    Obstetric History: G1P1001  Family History:  Family History  Problem Relation Age of Onset  . Diabetes Mellitus II Maternal Grandmother   . Diabetes Mellitus II Maternal Grandfather     Social History:  Social History   Socioeconomic History  . Marital status: Married    Spouse name: Not on file  . Number of children: Not on file  . Years of education: Not on file  . Highest education level: Not on file  Occupational History  . Not on file  Tobacco Use  . Smoking status: Current Every Day Smoker    Packs/day: 0.03    Types: Cigarettes  . Smokeless tobacco: Never Used  Substance and Sexual Activity  . Alcohol use: Yes  . Drug use: No  . Sexual activity: Yes    Birth control/protection: None  Other Topics Concern  . Not on file  Social History Narrative  . Not on file   Social Determinants of Health   Financial Resource Strain:   . Difficulty of Paying Living Expenses: Not on file  Food Insecurity:   . Worried About Charity fundraiser in the Last Year: Not on file  . Ran Out of Food in the Last Year: Not on file  Transportation Needs:   . Lack of Transportation (Medical): Not on file  .  Lack of Transportation (Non-Medical): Not on file  Physical Activity:   . Days of Exercise per Week: Not on file  . Minutes of Exercise per Session: Not on file  Stress:   . Feeling of Stress : Not on file  Social Connections:   . Frequency of Communication with Friends and Family: Not on file  . Frequency of Social Gatherings with Friends and Family: Not on file  . Attends Religious Services: Not on file  . Active Member of Clubs or Organizations: Not on file  . Attends Banker  Meetings: Not on file  . Marital Status: Not on file  Intimate Partner Violence:   . Fear of Current or Ex-Partner: Not on file  . Emotionally Abused: Not on file  . Physically Abused: Not on file  . Sexually Abused: Not on file    Allergies:  No Known Allergies  Medications: Prior to Admission medications   Medication Sig Start Date End Date Taking? Authorizing Provider  TREMFYA 100 MG/ML SOSY Inject 100 mg into the skin See admin instructions. Every other month 06/09/17  Yes [provider]  fluticasone (FLONASE) 50 MCG/ACT nasal spray Place 1 spray into both nostrils daily as needed for allergies or rhinitis.    [provider]  ibuprofen (ADVIL,MOTRIN) 600 MG tablet Take 1 tablet (600 mg total) by mouth every 6 (six) hours as needed for mild pain or cramping. 07/24/17   Conard Novak, MD  losartan-hydrochlorothiazide (HYZAAR) 50-12.5 MG tablet Take 1 tablet by mouth daily. 09/26/19   [provider]    Physical Exam Vitals: Blood pressure (!) 142/74, pulse 77, height 5\' 4"  (1.626 m), weight (!) 336 lb (152.4 kg), last menstrual period 09/20/2019. Body mass index is 57.67 kg/m.   General: NAD HEENT: normocephalic, anicteric Thyroid: no enlargement, no palpable nodules Pulmonary: No increased work of breathing, CTAB Cardiovascular: RRR, distal pulses 2+ Breast: Breast symmetrical, no tenderness, no palpable nodules or masses, no skin or nipple retraction present, no nipple discharge.  No axillary or supraclavicular lymphadenopathy. Abdomen: NABS, soft, non-tender, non-distended.  Umbilicus without lesions.  No hepatomegaly, splenomegaly or masses palpable. No evidence of hernia  Genitourinary:  External: Normal external female genitalia.  Normal urethral meatus, normal Bartholin's and Skene's glands.    Vagina: Normal vaginal mucosa, no evidence of prolapse.    Cervix: Grossly normal in appearance, no bleeding  Uterus: Non-enlarged, mobile,  normal contour.  No CMT  Adnexa: ovaries non-enlarged, no adnexal masses  Rectal: deferred  Lymphatic: no evidence of inguinal lymphadenopathy Extremities: no edema, erythema, or tenderness Neurologic: Grossly intact Psychiatric: mood appropriate, affect full  Female chaperone present for pelvic and breast  portions of the physical exam    Assessment: 39 y.o. G1P1001 routine annual exam  Plan: Problem List Items Addressed This Visit    None    Visit Diagnoses    Amenorrhea    -  Primary   Relevant Orders   POCT urine pregnancy (Completed)   Breast screening       Relevant Orders   MM 3D SCREEN BREAST BILATERAL   Encounter for gynecological examination without abnormal finding       Screening for malignant neoplasm of cervix       Relevant Orders   Cytology - PAP   Secondary amenorrhea       Relevant Orders   TSH+Prl+FSH+TestT+LH+DHEA S...   Hemoglobin A1c   24 Transvaginal Non-OB   Class 3 severe obesity with serious comorbidity and  body mass index (BMI) of 50.0 to 59.9 in adult, unspecified obesity type (HCC)       Relevant Orders   TSH+Prl+FSH+TestT+LH+DHEA S...   Hemoglobin A1c   Screening for diabetes mellitus       Relevant Orders   Hemoglobin A1c      1) STI screening  was notoffered and therefore not obtained  2)  ASCCP guidelines and rational discussed.  Patient opts for every 3 years screening interval  3) Ammenorhea - The risk long term risk of amenorrhea were discussed with the patient.  The role of unopposed estrogen in causing amenorrhea and the possible development of endometrial hyperplasia or carcinoma is discussed.  The risk of endometrial hyperplasia is linearly correlated with increasing BMI given the production of estrone by adipose tissue.  Although amenorrhea is the patients presenting complaint, we discussed that rather than simply addressing her symptom the goal of our work up would be aimed at identifying the underlying cause.  Disruption in  the axis of any number of endocrine systems may evidence itself in the form of amenorrhea. - PCOS panel - TVUS ordered   4) Routine healthcare maintenance including cholesterol, diabetes screening discussed managed by PCP  5) Return in about 1 week (around 11/22/2019) for 1-2 weeks TVUS and follow up.   Vena Austria, MD, [facility], [facility] 11/15/2019, 2:41 PM

## 2019-11-19 LAB — CYTOLOGY - PAP
Comment: NEGATIVE
Diagnosis: NEGATIVE
High risk HPV: NEGATIVE

## 2019-11-24 LAB — TSH+PRL+FSH+TESTT+LH+DHEA S...
17-Hydroxyprogesterone: <10 ng/dL
Androstenedione: 24 ng/dL — ABNORMAL LOW (ref 41–262)
DHEA-SO4: 53.2 ug/dL — ABNORMAL LOW (ref 57.3–279.2)
FSH: 4.3 m[IU]/mL
LH: 1.7 m[IU]/mL
Prolactin: 8.5 ng/mL (ref 4.8–23.3)
TSH: 2.49 u[IU]/mL (ref 0.450–4.500)
Testosterone, Free: 1.3 pg/mL (ref 0.0–4.2)
Testosterone: 11 ng/dL (ref 8–48)

## 2019-11-24 LAB — HEMOGLOBIN A1C
Est. average glucose Bld gHb Est-mCnc: 123 mg/dL
Hgb A1c MFr Bld: 5.9 % — ABNORMAL HIGH (ref 4.8–5.6)

## 2019-12-03 ENCOUNTER — Ambulatory Visit: Payer: BC Managed Care – PPO

## 2019-12-03 ENCOUNTER — Ambulatory Visit: Payer: BC Managed Care – PPO | Admitting: Obstetrics and Gynecology

## 2019-12-13 ENCOUNTER — Encounter: Payer: Self-pay | Admitting: Obstetrics and Gynecology

## 2019-12-13 ENCOUNTER — Other Ambulatory Visit: Payer: Self-pay | Admitting: Obstetrics and Gynecology

## 2019-12-13 ENCOUNTER — Ambulatory Visit (INDEPENDENT_AMBULATORY_CARE_PROVIDER_SITE_OTHER): Payer: BC Managed Care – PPO

## 2019-12-13 ENCOUNTER — Ambulatory Visit (INDEPENDENT_AMBULATORY_CARE_PROVIDER_SITE_OTHER): Payer: BC Managed Care – PPO | Admitting: Obstetrics and Gynecology

## 2019-12-13 ENCOUNTER — Other Ambulatory Visit: Payer: Self-pay

## 2019-12-13 VITALS — BP 132/84 | Wt 317.0 lb

## 2019-12-13 DIAGNOSIS — E282 Polycystic ovarian syndrome: Secondary | ICD-10-CM | POA: Diagnosis not present

## 2019-12-13 DIAGNOSIS — N938 Other specified abnormal uterine and vaginal bleeding: Secondary | ICD-10-CM

## 2019-12-13 DIAGNOSIS — N911 Secondary amenorrhea: Secondary | ICD-10-CM

## 2019-12-13 DIAGNOSIS — K625 Hemorrhage of anus and rectum: Secondary | ICD-10-CM | POA: Diagnosis not present

## 2019-12-13 DIAGNOSIS — K644 Residual hemorrhoidal skin tags: Secondary | ICD-10-CM | POA: Diagnosis not present

## 2019-12-13 MED ORDER — HYDROCORTISONE (PERIANAL) 2.5 % EX CREA: 1.0000 "application " | TOPICAL_CREAM | Freq: Two times a day (BID) | CUTANEOUS | 0 refills | Status: DC

## 2019-12-13 MED ORDER — MEDROXYPROGESTERONE ACETATE 10 MG PO TABS: 10.0000 mg | ORAL_TABLET | Freq: Every day | ORAL | 6 refills | Status: DC

## 2019-12-13 NOTE — Progress Notes (Signed)
Gynecology Ultrasound Follow Up  Chief Complaint:  Chief Complaint  Patient presents with  . Follow-up    GYN ultrasound  . Rectal Bleeding    pt thinks she has a hemorrhoid     History of Present Illness: Patient is a 39 y.o. female who presents today for ultrasound evaluation of secondary amenorrhea. Patient's last menstrual period was 11/16/2019 (exact date).   Ultrasound demonstrates the following findgins Adnexa: no masses seen Uterus: Non-enlarged, no uterine fibroids with endometrial stripe homogenous, non-thickened, and no focal lesions Additional: no free fluid  Review of Systems: Review of Systems  Constitutional: Negative.   Gastrointestinal: Negative.   Genitourinary: Negative.     Past Medical History:  Past Medical History:  Diagnosis Date  . Cellulitis   . Skin disorder     Past Surgical History:  Past Surgical History:  Procedure Laterality Date  . CESAREAN SECTION    . INTRAUTERINE DEVICE (IUD) INSERTION    . IUD REMOVAL N/A 07/24/2017   Procedure: DILATION & INTRAUTERINE DEVICE (IUD) REMOVAL;  Surgeon: Susan Novak, MD;  Location: [facility] ORS;  Service: Gynecology;  Laterality: N/A;    Gynecologic History:  Patient's last menstrual period was 11/16/2019 (exact date). Last Pap: 11/15/2019 Results were: .no abnormalities  Family History:  Family History  Problem Relation Age of Onset  . Diabetes Mellitus II Maternal Grandmother   . Diabetes Mellitus II Maternal Grandfather     Social History:  Social History   Socioeconomic History  . Marital status: Married    Spouse name: Not on file  . Number of children: Not on file  . Years of education: Not on file  . Highest education level: Not on file  Occupational History  . Not on file  Tobacco Use  . Smoking status: Current Every Day Smoker    Packs/day: 0.03    Types: Cigarettes  . Smokeless tobacco: Never Used  Substance and Sexual Activity  . Alcohol use: Yes  . Drug use: No    . Sexual activity: Yes    Birth control/protection: None  Other Topics Concern  . Not on file  Social History Narrative  . Not on file   Social Determinants of Health   Financial Resource Strain:   . Difficulty of Paying Living Expenses:   Food Insecurity:   . Worried About Programme researcher, broadcasting/film/video in the Last Year:   . Barista in the Last Year:   Transportation Needs:   . Freight forwarder (Medical):   Marland Kitchen Lack of Transportation (Non-Medical):   Physical Activity:   . Days of Exercise per Week:   . Minutes of Exercise per Session:   Stress:   . Feeling of Stress :   Social Connections:   . Frequency of Communication with Friends and Family:   . Frequency of Social Gatherings with Friends and Family:   . Attends Religious Services:   . Active Member of Clubs or Organizations:   . Attends Banker Meetings:   Marland Kitchen Marital Status:   Intimate Partner Violence:   . Fear of Current or Ex-Partner:   . Emotionally Abused:   Marland Kitchen Physically Abused:   . Sexually Abused:     Allergies:  No Known Allergies  Medications: Prior to Admission medications   Medication Sig Start Date End Date Taking? Authorizing Provider  losartan-hydrochlorothiazide (HYZAAR) 50-12.5 MG tablet Take 1 tablet by mouth daily. 09/26/19  Yes [provider]  TREMFYA 100 MG/ML  SOSY Inject 100 mg into the skin See admin instructions. Every other month 06/09/17  Yes [provider]  fluticasone (FLONASE) 50 MCG/ACT nasal spray Place 1 spray into both nostrils daily as needed for allergies or rhinitis.    [provider]  hydrocortisone (ANUSOL-HC) 2.5 % rectal cream Place 1 application rectally 2 (two) times daily. 12/13/19   Susan Mood, MD  ibuprofen (ADVIL,MOTRIN) 600 MG tablet Take 1 tablet (600 mg total) by mouth every 6 (six) hours as needed for mild pain or cramping. 07/24/17   Will Bonnet, MD  medroxyPROGESTERone (PROVERA) 10 MG tablet Take 1 tablet  (10 mg total) by mouth daily. Use if no menstrual cycle by day 35 12/13/19   Susan Mood, MD    Physical Exam Vitals: Blood pressure 132/84, weight (!) 317 lb (143.8 kg), last menstrual period 11/16/2019.  General: NAD HEENT: normocephalic, anicteric Pulmonary: No increased work of breathing Extremities: no edema, erythema, or tenderness Neurologic: Grossly intact, normal gait Psychiatric: Silva appropriate, affect full  US PELVIC COMPLETE WITH TRANSVAGINAL  Result Date: 12/14/2019 Patient Name: Susan Silva DOB: 12/19/1980 MRN: [id number] ULTRASOUND REPORT Location: [facility] Date of Service: 12/13/2019 Indications:Abnormal Uterine Bleeding Findings: The uterus is retroflexed and measures 9.6 x 4.1 x 3.5 cm. Echo texture is homogenous without evidence of focal masses. The Endometrium measures 7.3 mm. Right Ovary measures 1.9 x 1.7 x 1.7 cm. It is normal in appearance. Left Ovary is not visible.  Survey of the adnexa demonstrates no adnexal masses. There is no free fluid in the cul de sac. Impression: 1. The endometrium is not well visualized. 2. Normal appearing ultrasound. Recommendations: 1.Clinical correlation with the patient's History and Physical Exam. Susan Silva, RT Images reviewed.  Normal GYN study without visualized pathology.  Susan Mood, MD, [facility], [facility]    Assessment: 39 y.o. G1P1001 follow up secondary amenorrhea Plan: Problem List Items Addressed This Visit    None    Visit Diagnoses    External hemorrhoid    -  Primary   PCOS (polycystic ovarian syndrome)          1) Secondary amenorrhea - has had menstrual cycle since last visit but they remain increased in interval length.  No pathology visualizd on ultrasound today, no intermenstrual spotting.  We discussed that mainstay of treatment would be cycle suppression or regulation with a progestin.  Given her prior history of retained IUD she is naturally not supper  excited about going that route again.  The patient opts for more conservative management and in this case we discussed the use of Provera if no menses by cycle day 35  2) Hemorrhoids -  anusol  3) Pre-diabetes - insulin resistance noted on HgbA1C measurement  4) A total of 15 minutes were spent in face-to-face contact with the patient during this encounter with over half of that time devoted to counseling and coordination of care.  5) Return in about 1 year (around 12/12/2020) for annual.    Susan Mood, MD, [facility], [place]

## 2020-03-30 ENCOUNTER — Ambulatory Visit
Admission: RE | Admit: 2020-03-30 | Discharge: 2020-03-30 | Disposition: A | Discharge status: Home / Self Care | Payer: BC Managed Care – PPO | Source: Ambulatory Visit | Attending: Obstetrics and Gynecology | Admitting: Obstetrics and Gynecology

## 2020-03-30 DIAGNOSIS — Z1231 Encounter for screening mammogram for malignant neoplasm of breast: Secondary | ICD-10-CM | POA: Insufficient documentation

## 2020-03-30 DIAGNOSIS — Z1239 Encounter for other screening for malignant neoplasm of breast: Secondary | ICD-10-CM

## 2020-08-30 ENCOUNTER — Ambulatory Visit: Payer: BC Managed Care – PPO | Admitting: Obstetrics and Gynecology

## 2020-09-08 ENCOUNTER — Ambulatory Visit: Payer: BC Managed Care – PPO | Admitting: Obstetrics and Gynecology

## 2020-09-11 ENCOUNTER — Ambulatory Visit (INDEPENDENT_AMBULATORY_CARE_PROVIDER_SITE_OTHER): Payer: BC Managed Care – PPO | Admitting: Obstetrics and Gynecology

## 2020-09-11 ENCOUNTER — Encounter: Payer: Self-pay | Admitting: Obstetrics and Gynecology

## 2020-09-11 ENCOUNTER — Other Ambulatory Visit: Payer: Self-pay

## 2020-09-11 VITALS — BP 132/78 | Ht 64.0 in | Wt 332.0 lb

## 2020-09-11 DIAGNOSIS — N911 Secondary amenorrhea: Secondary | ICD-10-CM | POA: Diagnosis not present

## 2020-09-11 MED ORDER — SLYND 4 MG PO TABS: 1.0000 | ORAL_TABLET | Freq: Every day | ORAL | 3 refills | Status: DC

## 2020-09-11 NOTE — Progress Notes (Signed)
Obstetrics & Gynecology Office Visit   Chief Complaint:  Chief Complaint  Patient presents with  . Amenorrhea    Anenorrhea - RM 4    History of Present Illness: 39 y.o. G1P1001 presenting for the evaluation of absent menstruation.  The patient report Patient's last menstrual period was 07/01/2020.Marland Kitchen  Preceding the current episode of amenorrhea, the patient's menstrual cycles had been irregular.  The patient is currently sexually active and is using vasectomy for contraception.  She does have any contributing past medical history.  The patient has not started any new medications coinciding with the onset of her symptoms.  The patient is not interested in conceiving in the near future.  She does not exercise excessively.  Had withdrawal bleed following provera administration but failed to resume normal cycles afterwards.  Work up earlier this year normal.  She did have thyroid screening repeat recently by PCP with result still normal.    Review of Systems: Review of Systems  Gastrointestinal: Negative.   Genitourinary: Negative.   Neurological: Negative for headaches.     Past Medical History:  Patient Active Problem List   Diagnosis Date Noted  . Complication of intrauterine device (IUD), sequela 05/16/2017    Past Surgical History:  Past Surgical History:  Procedure Laterality Date  . CESAREAN SECTION    . INTRAUTERINE DEVICE (IUD) INSERTION    . IUD REMOVAL N/A 07/24/2017   Procedure: DILATION & INTRAUTERINE DEVICE (IUD) REMOVAL;  Surgeon: Conard Novak, MD;  Location: [facility] ORS;  Service: Gynecology;  Laterality: N/A;    Gynecologic History: Patient's last menstrual period was 07/01/2020.  Obstetric History: G1P1001  Family History:  Family History  Problem Relation Age of Onset  . Diabetes Mellitus II Maternal Grandmother   . Diabetes Mellitus II Maternal Grandfather     Social History:  Social History   Socioeconomic History  . Marital status:  Married    Spouse name: Not on file  . Number of children: Not on file  . Years of education: Not on file  . Highest education level: Not on file  Occupational History  . Not on file  Tobacco Use  . Smoking status: Current Every Day Smoker    Packs/day: 0.03    Types: Cigarettes  . Smokeless tobacco: Never Used  Vaping Use  . Vaping Use: Never used  Substance and Sexual Activity  . Alcohol use: Yes  . Drug use: No  . Sexual activity: Yes    Birth control/protection: None  Other Topics Concern  . Not on file  Social History Narrative  . Not on file   Social Determinants of Health   Financial Resource Strain: Not on file  Food Insecurity: Not on file  Transportation Needs: Not on file  Physical Activity: Not on file  Stress: Not on file  Social Connections: Not on file  Intimate Partner Violence: Not on file    Allergies:  No Known Allergies  Medications: Prior to Admission medications   Medication Sig Start Date End Date Taking? Authorizing Provider  escitalopram (LEXAPRO) 10 MG tablet Take by mouth. 08/21/20 11/19/20 Yes [provider]  losartan-hydrochlorothiazide (HYZAAR) 50-12.5 MG tablet Take 1 tablet by mouth daily. 09/26/19  Yes [provider]  TREMFYA 100 MG/ML SOSY Inject 100 mg into the skin See admin instructions. Every other month 06/09/17  Yes [provider]  Drospirenone (SLYND) 4 MG TABS Take 1 tablet by mouth daily. 09/11/20   Vena Austria, MD  fluticasone (FLONASE) 50 MCG/ACT nasal spray Place 1 spray into both nostrils daily as needed for allergies or rhinitis.    [provider]  hydrocortisone (ANUSOL-HC) 2.5 % rectal cream Place 1 application rectally 2 (two) times daily. Patient not taking: Reported on 09/11/2020 12/13/19   Vena Austria, MD  ibuprofen (ADVIL,MOTRIN) 600 MG tablet Take 1 tablet (600 mg total) by mouth every 6 (six) hours as needed for mild pain or cramping. 07/24/17   Conard Novak,  MD    Physical Exam Blood pressure 132/78, height 5\' 4"  (1.626 m), weight (!) 332 lb (150.6 kg), last menstrual period 07/01/2020. Patient's last menstrual period was 07/01/2020. Body mass index is 56.99 kg/m.  General: NAD, well nourished, appears stated age HEENT: normocephalic, anicteric Pulmonary: No increased work of breathing Cardiovascular: RRR, distal pulses 2+ Neurologic: Grossly intact Psychiatric: mood appropriate, affect full  Female chaperone present for pelvic portion of the physical exam  Assessment: 39 y.o. G1P1001 presenting with secondary amenorrhea  Plan: Problem List Items Addressed This Visit   None   Visit Diagnoses    Secondary amenorrhea    -  Primary      1) The risk long term risk of amenorrhea were discussed with the patient.  The role of unopposed estrogen in causing amenorrhea and the possible development of endometrial hyperplasia or carcinoma is discussed.  The risk of endometrial hyperplasia is linearly correlated with increasing BMI given the production of estrone by adipose tissue.    2) Prior work up was normal, including normal ultrasound.  She did have a withdrawal bleed following provera challenge as well.  We discussed options for either cycle suppression and endometrial protection vs cycle regulation.  She is most interested in a trial of slynd.  She would not be opposed to IUD despite having a prior IUD that required surgical removal.  4) Return in about 3 months (around 12/10/2020) for annual +/- Mirena.   12/12/2020, MD, [facility], [facility]

## 2020-11-20 ENCOUNTER — Ambulatory Visit: Payer: BC Managed Care – PPO | Admitting: Obstetrics and Gynecology

## 2020-12-18 ENCOUNTER — Ambulatory Visit: Payer: BC Managed Care – PPO | Admitting: Obstetrics and Gynecology

## 2021-07-24 ENCOUNTER — Other Ambulatory Visit: Payer: Self-pay

## 2021-07-24 ENCOUNTER — Ambulatory Visit (INDEPENDENT_AMBULATORY_CARE_PROVIDER_SITE_OTHER): Payer: BC Managed Care – PPO | Admitting: Obstetrics and Gynecology

## 2021-07-24 ENCOUNTER — Encounter: Payer: Self-pay | Admitting: Obstetrics and Gynecology

## 2021-07-24 VITALS — BP 124/72 | HR 93 | Ht 64.0 in | Wt 304.0 lb

## 2021-07-24 DIAGNOSIS — N911 Secondary amenorrhea: Secondary | ICD-10-CM | POA: Diagnosis not present

## 2021-07-24 DIAGNOSIS — Z1239 Encounter for other screening for malignant neoplasm of breast: Secondary | ICD-10-CM

## 2021-07-24 DIAGNOSIS — Z01419 Encounter for gynecological examination (general) (routine) without abnormal findings: Secondary | ICD-10-CM | POA: Diagnosis not present

## 2021-07-24 MED ORDER — HYDROCORTISONE (PERIANAL) 2.5 % EX CREA: 1.0000 "application " | TOPICAL_CREAM | Freq: Two times a day (BID) | CUTANEOUS | 0 refills | Status: DC

## 2021-07-24 MED ORDER — MEDROXYPROGESTERONE ACETATE 10 MG PO TABS: 10.0000 mg | ORAL_TABLET | Freq: Every day | ORAL | 4 refills | Status: DC

## 2021-07-24 NOTE — Patient Instructions (Signed)
[facility] [address] [place] [address]  [facility] [place]  [address] [place] [address]  Phone: [phone]  

## 2021-07-24 NOTE — Progress Notes (Signed)
Gynecology Annual Exam  PCP: Kandyce Rud, MD  Chief Complaint:  Chief Complaint  Patient presents with   Gynecologic Exam    Annual - (Gastric sleeve 06/08/21) no concerns. RM 5    History of Present Illness: Patient is a 40 y.o. G1P1001 presents for annual exam. The patient has no complaints today.   LMP: Patient's last menstrual period was 03/09/2021. Irregular has not has a menstrual cycle for last 4 months.  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    The patient does perform self breast exams.  There is no notable family history of breast or ovarian cancer in her family.  The patient wears seatbelts: yes.   The patient has regular exercise: not asked.    The patient denies current symptoms of depression.    Review of Systems: Review of Systems  Constitutional: Negative.  Negative for chills and fever.  HENT:  Negative for congestion.   Respiratory:  Negative for cough and shortness of breath.   Cardiovascular:  Negative for chest pain and palpitations.  Gastrointestinal: Negative.  Negative for abdominal pain, constipation, diarrhea, heartburn, nausea and vomiting.  Genitourinary: Negative.  Negative for dysuria, frequency and urgency.  Skin:  Negative for itching and rash.  Neurological:  Negative for dizziness and headaches.  Endo/Heme/Allergies:  Negative for polydipsia.  Psychiatric/Behavioral:  Negative for depression.    Past Medical History:  Patient Active Problem List   Diagnosis Date Noted   Complication of intrauterine device (IUD), sequela 05/16/2017    Past Surgical History:  Past Surgical History:  Procedure Laterality Date   CESAREAN SECTION     INTRAUTERINE DEVICE (IUD) INSERTION     IUD REMOVAL N/A 07/24/2017   Procedure: DILATION & INTRAUTERINE DEVICE (IUD) REMOVAL;  Surgeon: Conard Novak, MD;  Location: [facility] ORS;  Service: Gynecology;  Laterality: N/A;    Gynecologic History:  Patient's last menstrual period was  03/09/2021. Contraception: none Last Pap: Results were: 2/15/2021NIL and HR HPV negative  Last mammogram: 03/30/2020 Results were: BI-RAD I  Obstetric History: G1P1001  Family History:  Family History  Problem Relation Age of Onset   Diabetes Mellitus II Maternal Grandmother    Diabetes Mellitus II Maternal Grandfather     Social History:  Social History   Socioeconomic History   Marital status: Married    Spouse name: Not on file   Number of children: Not on file   Years of education: Not on file   Highest education level: Not on file  Occupational History   Not on file  Tobacco Use   Smoking status: Every Day    Packs/day: 0.03    Types: Cigarettes   Smokeless tobacco: Never  Vaping Use   Vaping Use: Never used  Substance and Sexual Activity   Alcohol use: Yes   Drug use: No   Sexual activity: Yes    Birth control/protection: Surgical    Comment: Vasectomy  Other Topics Concern   Not on file  Social History Narrative   Not on file   Social Determinants of Health   Financial Resource Strain: Not on file  Food Insecurity: Not on file  Transportation Needs: Not on file  Physical Activity: Not on file  Stress: Not on file  Social Connections: Not on file  Intimate Partner Violence: Not on file    Allergies:  No Known Allergies  Medications: Prior to Admission medications   Medication Sig Start Date End Date Taking? Authorizing Provider  TREMFYA 100  MG/ML SOSY Inject 100 mg into the skin See admin instructions. Every other month 06/09/17  Yes [provider]  Drospirenone (SLYND) 4 MG TABS Take 1 tablet by mouth daily. Patient not taking: Reported on 07/24/2021 09/11/20   Vena Austria, MD  escitalopram Judye Bos) 10 MG tablet Take by mouth. 08/21/20 11/19/20  [provider]  hydrocortisone (ANUSOL-HC) 2.5 % rectal cream Place 1 application rectally 2 (two) times daily. Patient not taking: Reported on 09/11/2020 12/13/19   Vena Austria, MD  losartan-hydrochlorothiazide [facility]) 50-12.5 MG tablet Take 1 tablet by mouth daily. 09/26/19   [provider]    Physical Exam Vitals: Blood pressure 124/72, pulse 93, height 5\' 4"  (1.626 m), weight (!) 304 lb (137.9 kg), last menstrual period 03/09/2021.  General: NAD HEENT: normocephalic, anicteric Thyroid: no enlargement, no palpable nodules Pulmonary: No increased work of breathing, CTAB Cardiovascular: RRR, distal pulses 2+ Breast: Breast symmetrical, no tenderness, no palpable nodules or masses, no skin or nipple retraction present, no nipple discharge.  No axillary or supraclavicular lymphadenopathy. Abdomen: NABS, soft, non-tender, non-distended.  Umbilicus without lesions.  No hepatomegaly, splenomegaly or masses palpable. No evidence of hernia  Genitourinary:  External: Normal external female genitalia.  Normal urethral meatus, normal Bartholin's and Skene's glands.    Vagina: Normal vaginal mucosa, no evidence of prolapse.    Cervix: Grossly normal in appearance, no bleeding  Uterus: Non-enlarged, mobile, normal contour.  No CMT  Adnexa: ovaries non-enlarged, no adnexal masses  Rectal: deferred  Lymphatic: no evidence of inguinal lymphadenopathy Extremities: no edema, erythema, or tenderness Neurologic: Grossly intact Psychiatric: mood appropriate, affect full  Female chaperone present for pelvic and breast  portions of the physical exam  Assessment: 40 y.o. G1P1001 routine annual exam  Plan: Problem List Items Addressed This Visit   None Visit Diagnoses     Encounter for gynecological examination without abnormal finding    -  Primary   Breast screening       Relevant Orders   MM 3D SCREEN BREAST BILATERAL       1) Mammogram - recommend yearly screening mammogram.  Mammogram Was ordered today   2) STI screening  was not offered and therefore not obtained  3) ASCCP guidelines and rational discussed.  Patient opts for every 3 years  screening interval  4) Contraception - the patient is currently using  none.  She is happy with her current form of contraception and plans to continue  5) Colonoscopy -- Screening recommended starting at age 55 for average risk individuals, age 30 for individuals deemed at increased risk (including African Americans) and recommended to continue until age 105.  For patient age 7-85 individualized approach is recommended.  Gold standard screening is via colonoscopy, Cologuard screening is an acceptable alternative for patient unwilling or unable to undergo colonoscopy.  "Colorectal cancer screening for average?risk adults: 2018 guideline update from the American Cancer Society"[place]: A Cancer Journal for Clinicians: Feb 26, 2017   6) Routine healthcare maintenance including cholesterol, diabetes screening discussed managed by PCP  7) Secondary Amenorrhea - discussed contraceptive options for cycle regulation.  At present patient opts for expectant mangement to see if cycles regular with weight loss following gastric sleeve   8) Return in about 1 year (around 07/24/2022) for annual.   07/26/2022, MD, Vena Austria [facility], [facility] 07/24/2021, 3:45 PM

## 2021-08-06 ENCOUNTER — Other Ambulatory Visit: Payer: Self-pay

## 2021-08-06 ENCOUNTER — Ambulatory Visit
Admission: RE | Admit: 2021-08-06 | Discharge: 2021-08-06 | Disposition: A | Discharge status: Home / Self Care | Payer: BC Managed Care – PPO | Source: Ambulatory Visit | Attending: Obstetrics and Gynecology | Admitting: Obstetrics and Gynecology

## 2021-08-06 DIAGNOSIS — Z1239 Encounter for other screening for malignant neoplasm of breast: Secondary | ICD-10-CM

## 2021-08-06 DIAGNOSIS — Z1231 Encounter for screening mammogram for malignant neoplasm of breast: Secondary | ICD-10-CM | POA: Insufficient documentation

## 2022-07-24 NOTE — Progress Notes (Unsigned)
PCP:  Derinda Late, MD   No chief complaint on file.    HPI:      [name] is a 41 y.o. G1P1001 whose LMP was No LMP recorded., presents today for her annual examination.  Her menses are {norm/abn:715}, lasting {number: 22536} days.  Dysmenorrhea {dysmen:716}. She {does:18564} have intermenstrual bleeding.  Sex activity: {sex active: 315163}.  Last Pap: 11/15/19 Results were: no abnormalities /neg HPV DNA  Hx of STDs: {STD hx:14358}  Last mammogram: 08/06/21  Results were: normal--routine follow-up in 12 months There is no FH of breast cancer. There is no FH of ovarian cancer. The patient {does:18564} do self-breast exams.  Tobacco use: {tob:20664} Alcohol use: {Alcohol:11675} No drug use.  Exercise: {exercise:31265}  She {does:18564} get adequate calcium and Vitamin D in her diet.  Patient Active Problem List   Diagnosis Date Noted   Complication of intrauterine device (IUD), sequela 05/16/2017    Past Surgical History:  Procedure Laterality Date   CESAREAN SECTION     INTRAUTERINE DEVICE (IUD) INSERTION     IUD REMOVAL N/A 07/24/2017   Procedure: DILATION & INTRAUTERINE DEVICE (IUD) REMOVAL;  Surgeon: Will Bonnet, MD;  Location: [facility] ORS;  Service: Gynecology;  Laterality: N/A;    Family History  Problem Relation Age of Onset   Diabetes Mellitus II Maternal Grandmother    Diabetes Mellitus II Maternal Grandfather    Breast cancer Neg Hx     Social History   Socioeconomic History   Marital status: Married    Spouse name: Not on file   Number of children: Not on file   Years of education: Not on file   Highest education level: Not on file  Occupational History   Not on file  Tobacco Use   Smoking status: Every Day    Packs/day: 0.03    Types: Cigarettes   Smokeless tobacco: Never  Vaping Use   Vaping Use: Never used  Substance and Sexual Activity   Alcohol use: Yes   Drug use: No   Sexual activity: Yes    Birth control/protection:  Surgical    Comment: Vasectomy  Other Topics Concern   Not on file  Social History Narrative   Not on file   Social Determinants of Health   Financial Resource Strain: Not on file  Food Insecurity: Not on file  Transportation Needs: Not on file  Physical Activity: Not on file  Stress: Not on file  Social Connections: Not on file  Intimate Partner Violence: Not on file     Current Outpatient Medications:    escitalopram (LEXAPRO) 10 MG tablet, Take by mouth., Disp: , Rfl:    hydrocortisone (ANUSOL-HC) 2.5 % rectal cream, Place 1 application rectally 2 (two) times daily., Disp: 30 g, Rfl: 0   losartan-hydrochlorothiazide (HYZAAR) 50-12.5 MG tablet, Take 1 tablet by mouth daily., Disp: , Rfl:    medroxyPROGESTERone (PROVERA) 10 MG tablet, Take 1 tablet (10 mg total) by mouth daily for 10 days. If no menstrual cycle after 3 months, Disp: 10 tablet, Rfl: 4   TREMFYA 100 MG/ML SOSY, Inject 100 mg into the skin See admin instructions. Every other month, Disp: , Rfl: 0     ROS:  Review of Systems BREAST: No symptoms   Objective: There were no vitals taken for this visit.   OBGyn Exam  Results: No results found for this or any previous visit (from the past 24 hour(s)).  Assessment/Plan: No diagnosis found.  No orders of  the defined types were placed in this encounter.            GYN counsel {counseling: 16159}     F/U  No follow-ups on file.  Rashidi Loh B. Natalina Wieting, PA-C 07/24/2022 9:35 PM

## 2022-07-25 ENCOUNTER — Ambulatory Visit (INDEPENDENT_AMBULATORY_CARE_PROVIDER_SITE_OTHER): Payer: BC Managed Care – PPO | Admitting: Obstetrics and Gynecology

## 2022-07-25 ENCOUNTER — Encounter: Payer: Self-pay | Admitting: Obstetrics and Gynecology

## 2022-07-25 ENCOUNTER — Ambulatory Visit: Payer: Self-pay | Admitting: Obstetrics and Gynecology

## 2022-07-25 VITALS — BP 120/80 | Ht 64.0 in | Wt 267.0 lb

## 2022-07-25 DIAGNOSIS — N911 Secondary amenorrhea: Secondary | ICD-10-CM | POA: Diagnosis not present

## 2022-07-25 DIAGNOSIS — Z01419 Encounter for gynecological examination (general) (routine) without abnormal findings: Secondary | ICD-10-CM

## 2022-07-25 DIAGNOSIS — Z1231 Encounter for screening mammogram for malignant neoplasm of breast: Secondary | ICD-10-CM

## 2022-07-25 DIAGNOSIS — K644 Residual hemorrhoidal skin tags: Secondary | ICD-10-CM

## 2022-07-30 LAB — PROLACTIN: Prolactin: 4.2 ng/mL — ABNORMAL LOW (ref 4.8–23.3)

## 2022-07-30 LAB — FSH/LH
FSH: 46.5 m[IU]/mL
LH: 45.8 m[IU]/mL

## 2022-07-30 LAB — TESTOSTERONE,FREE AND TOTAL
Testosterone, Free: 0.2 pg/mL (ref 0.0–4.2)
Testosterone: <3 ng/dL — ABNORMAL LOW (ref 4–50)

## 2022-09-06 IMAGING — MG MM DIGITAL SCREENING BILAT W/ TOMO AND CAD
8 series · 8 of 24 positions shown · non-contrast
Comparison: Previous exam(s).

CLINICAL DATA: Screening.

EXAM:
DIGITAL SCREENING BILATERAL MAMMOGRAM WITH TOMOSYNTHESIS AND CAD
TECHNIQUE: Bilateral screening digital craniocaudal and mediolateral oblique
mammograms were obtained. Bilateral screening digital breast
tomosynthesis was performed. The images were evaluated with
computer-aided detection.

[R MLO synth-2D]
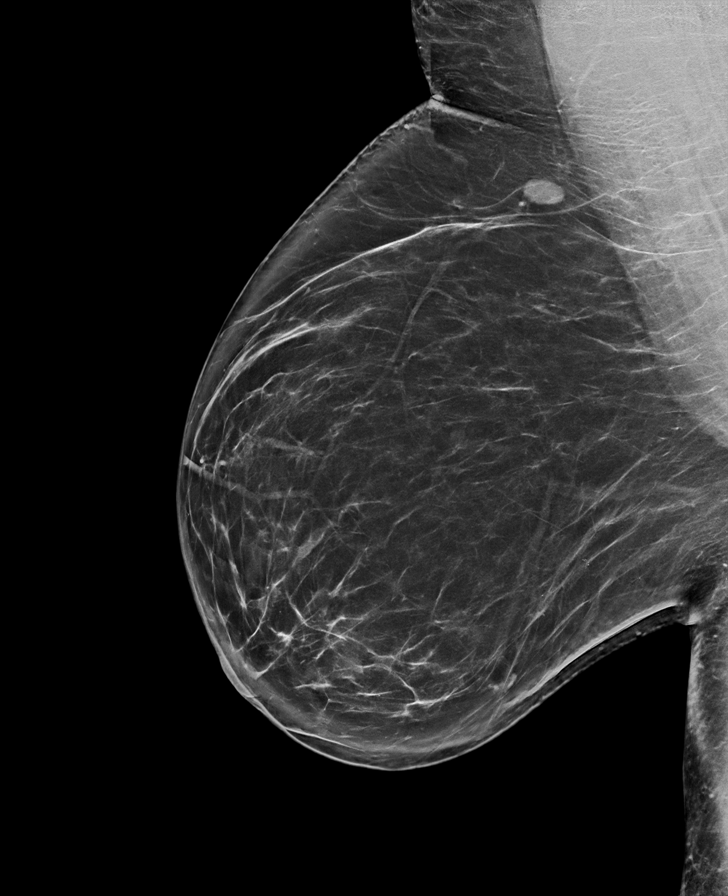

[L MLO synth-2D]
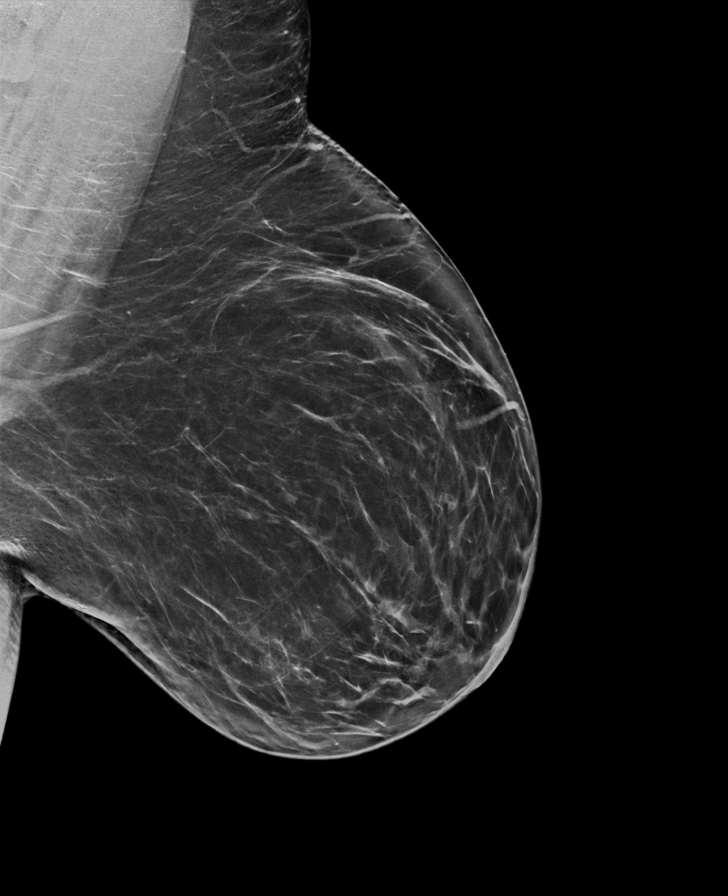

[L CC synth-2D]
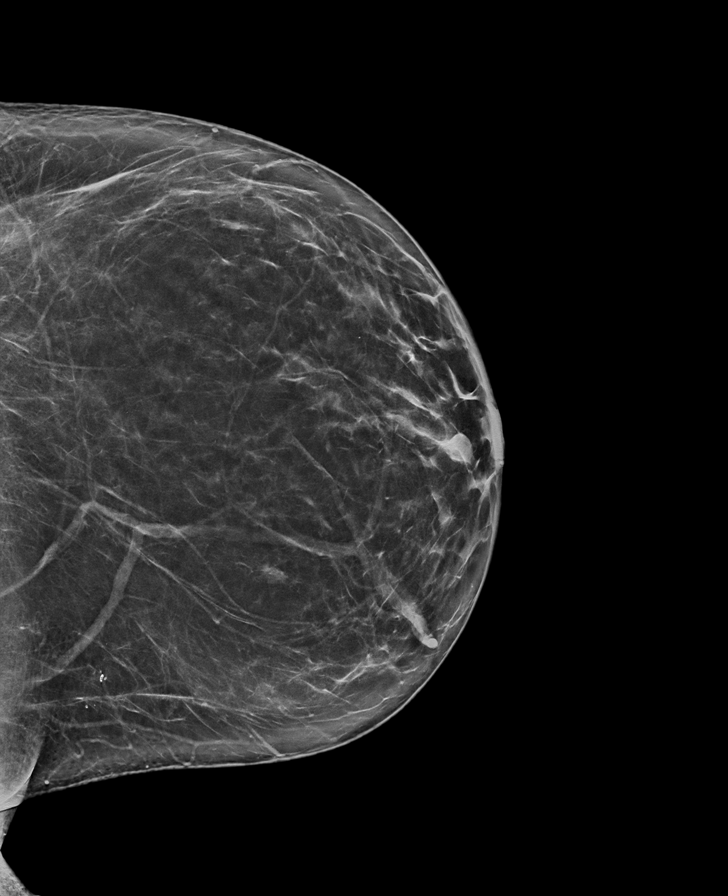

[R CC synth-2D]
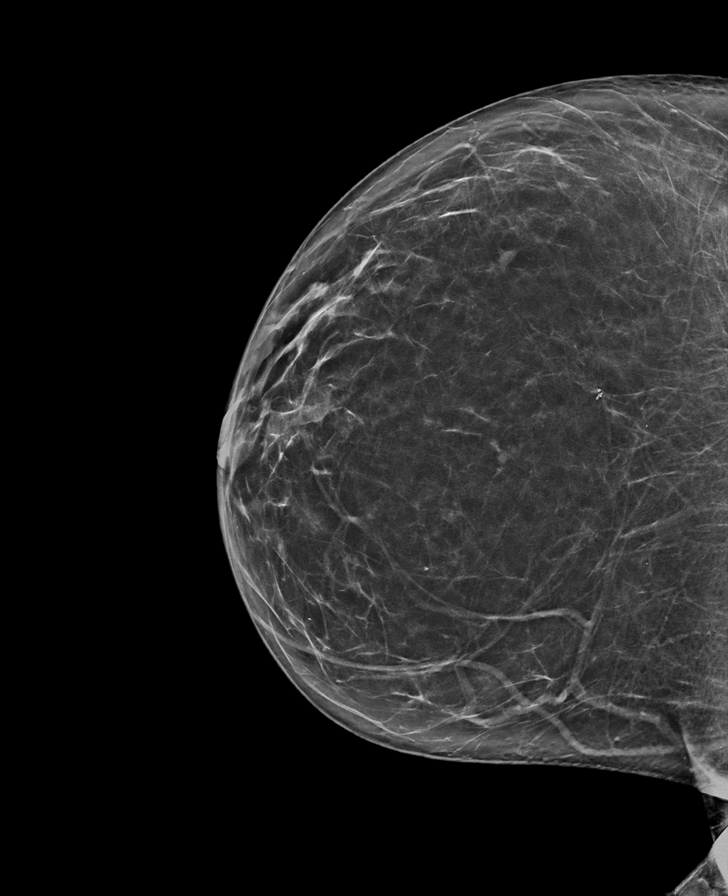

[R CC tomo · tomo slice 41/80.0]
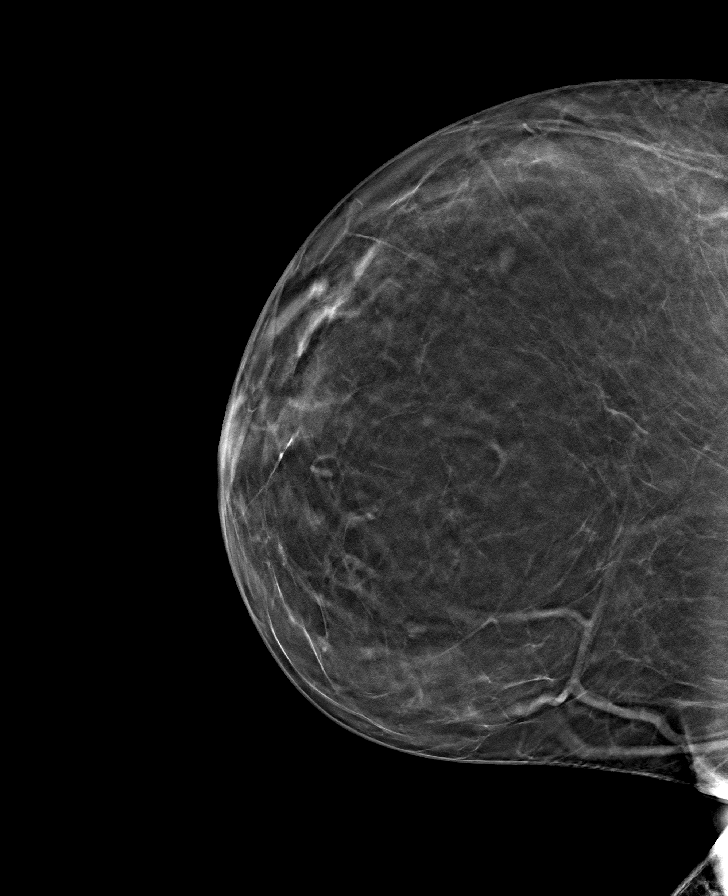

[L CC tomo · tomo slice 41/80.0]
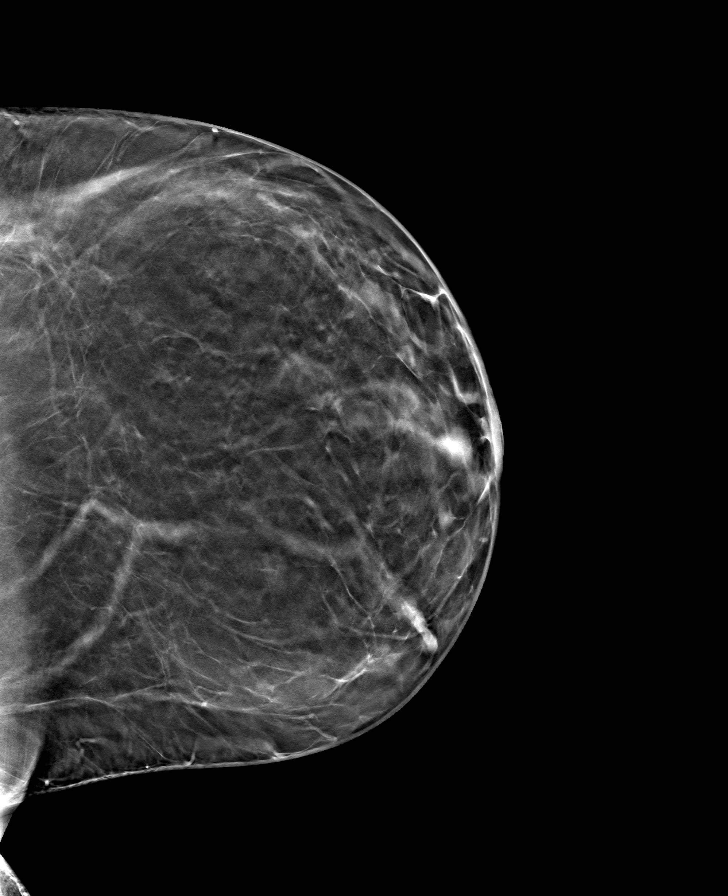

[L MLO tomo · tomo slice 47/93.0]
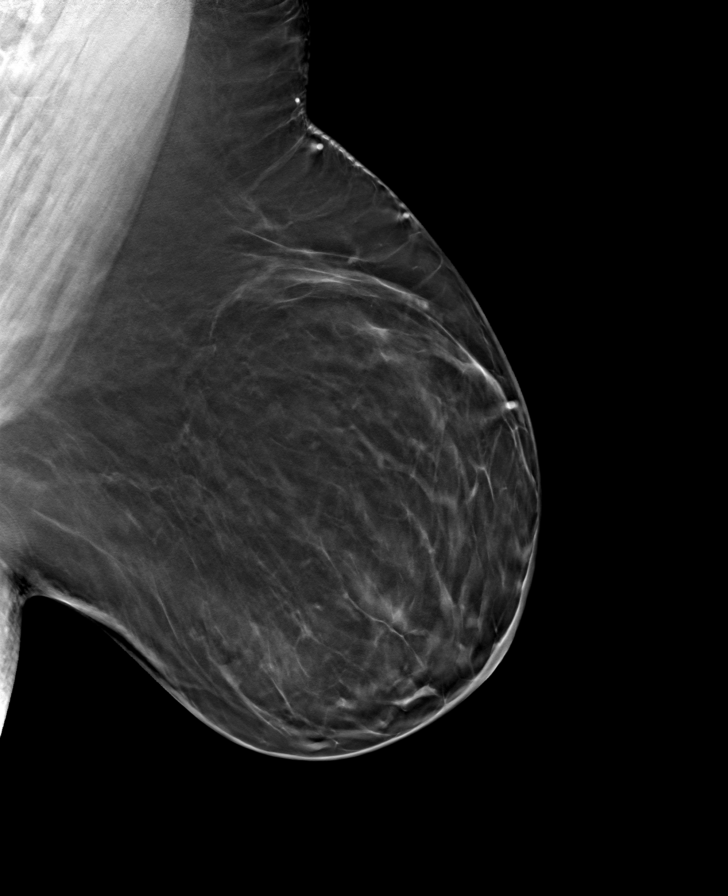

[R MLO tomo · tomo slice 47/94.0]
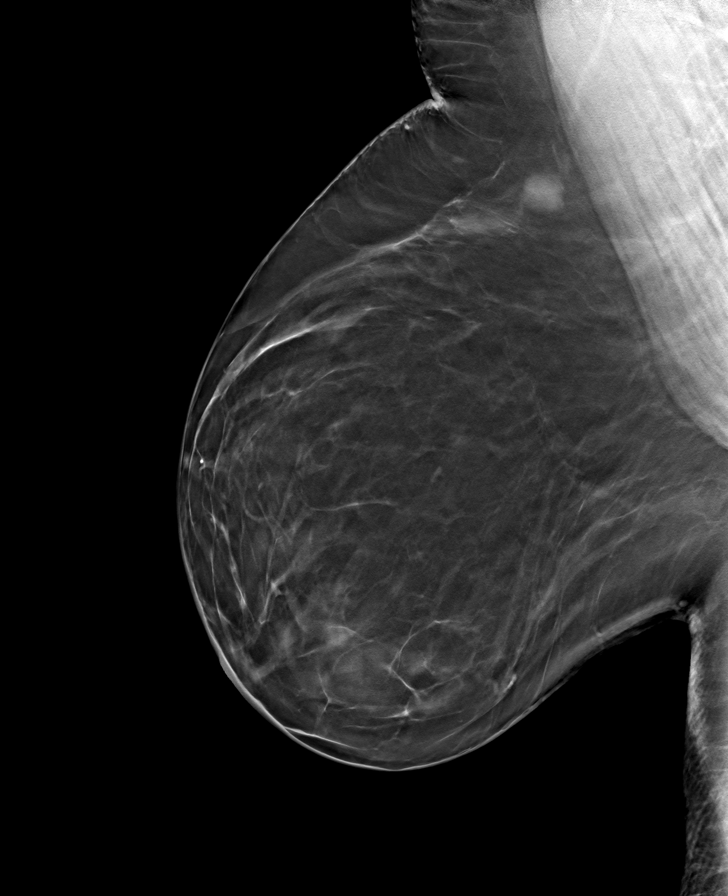

[8 of 24 positions shown; findings below may reference images not displayed]

ACR Breast Density Category b: There are scattered areas of
fibroglandular density.
FINDINGS: There are no findings suspicious for malignancy.
IMPRESSION: No mammographic evidence of malignancy. A result letter of this
screening mammogram will be mailed directly to the patient.

RECOMMENDATION:
Screening mammogram in one year. (Code:51-O-LD2)

BI-RADS CATEGORY  1: Negative.

## 2022-12-30 ENCOUNTER — Ambulatory Visit
Admission: RE | Admit: 2022-12-30 | Discharge: 2022-12-30 | Disposition: A | Discharge status: Home / Self Care | Payer: BC Managed Care – PPO | Source: Ambulatory Visit | Attending: Obstetrics and Gynecology | Admitting: Obstetrics and Gynecology

## 2022-12-30 DIAGNOSIS — Z1231 Encounter for screening mammogram for malignant neoplasm of breast: Secondary | ICD-10-CM | POA: Insufficient documentation

## 2022-12-31 ENCOUNTER — Encounter: Payer: Self-pay | Admitting: Obstetrics and Gynecology

## 2023-12-11 ENCOUNTER — Other Ambulatory Visit: Payer: Self-pay | Admitting: Obstetrics and Gynecology

## 2023-12-11 ENCOUNTER — Other Ambulatory Visit: Payer: Self-pay | Admitting: Family Medicine

## 2023-12-11 DIAGNOSIS — Z1231 Encounter for screening mammogram for malignant neoplasm of breast: Secondary | ICD-10-CM

## 2024-01-01 ENCOUNTER — Ambulatory Visit
Admission: RE | Admit: 2024-01-01 | Discharge: 2024-01-01 | Disposition: A | Discharge status: Home / Self Care | Source: Ambulatory Visit | Attending: Family Medicine | Admitting: Family Medicine

## 2024-01-01 DIAGNOSIS — Z1231 Encounter for screening mammogram for malignant neoplasm of breast: Secondary | ICD-10-CM | POA: Diagnosis present
